# Patient Record
Sex: Male | Born: 1965 | Race: Black or African American | Hispanic: No | Marital: Single | Smoking: Current every day smoker
Health system: Southern US, Community
[De-identification: ages and names within clinical notes are randomized; demographics above are authoritative.]

## PROBLEM LIST (undated history)

## (undated) DIAGNOSIS — E785 Hyperlipidemia, unspecified: Secondary | ICD-10-CM

## (undated) DIAGNOSIS — A159 Respiratory tuberculosis unspecified: Secondary | ICD-10-CM

## (undated) DIAGNOSIS — E7800 Pure hypercholesterolemia, unspecified: Secondary | ICD-10-CM

## (undated) DIAGNOSIS — F1990 Other psychoactive substance use, unspecified, uncomplicated: Secondary | ICD-10-CM

## (undated) DIAGNOSIS — F1010 Alcohol abuse, uncomplicated: Secondary | ICD-10-CM

## (undated) DIAGNOSIS — J449 Chronic obstructive pulmonary disease, unspecified: Secondary | ICD-10-CM

## (undated) HISTORY — DX: Pure hypercholesterolemia, unspecified: E78.00

## (undated) HISTORY — PX: FINGER SURGERY: SHX640

## (undated) SURGERY — LEFT HEART CATH
Anesthesia: LOCAL

---

## 2015-04-21 ENCOUNTER — Emergency Department (HOSPITAL_COMMUNITY): Payer: Self-pay

## 2015-04-21 ENCOUNTER — Encounter (HOSPITAL_COMMUNITY): Payer: Self-pay | Admitting: Emergency Medicine

## 2015-04-21 ENCOUNTER — Emergency Department (HOSPITAL_COMMUNITY)
Admission: EM | Admit: 2015-04-21 | Discharge: 2015-04-21 | Disposition: A | Discharge status: Home / Self Care | Payer: Self-pay | Attending: Emergency Medicine | Admitting: Emergency Medicine

## 2015-04-21 DIAGNOSIS — Z720 Tobacco use: Secondary | ICD-10-CM | POA: Insufficient documentation

## 2015-04-21 DIAGNOSIS — S199XXA Unspecified injury of neck, initial encounter: Secondary | ICD-10-CM | POA: Insufficient documentation

## 2015-04-21 DIAGNOSIS — Y9389 Activity, other specified: Secondary | ICD-10-CM | POA: Insufficient documentation

## 2015-04-21 DIAGNOSIS — Y92410 Unspecified street and highway as the place of occurrence of the external cause: Secondary | ICD-10-CM | POA: Insufficient documentation

## 2015-04-21 DIAGNOSIS — S0990XA Unspecified injury of head, initial encounter: Secondary | ICD-10-CM | POA: Insufficient documentation

## 2015-04-21 DIAGNOSIS — S3992XA Unspecified injury of lower back, initial encounter: Secondary | ICD-10-CM | POA: Insufficient documentation

## 2015-04-21 DIAGNOSIS — Y998 Other external cause status: Secondary | ICD-10-CM | POA: Insufficient documentation

## 2015-04-21 MED ORDER — HYDROCODONE-ACETAMINOPHEN 5-325 MG PO TABS
1.0000 | ORAL_TABLET | ORAL | Status: DC
  Filled 2015-04-21: qty 1

## 2015-04-21 MED ORDER — NAPROXEN 500 MG PO TABS: 500.0000 mg | ORAL_TABLET | Freq: Two times a day (BID) | ORAL | Status: DC

## 2015-04-21 NOTE — Discharge Instructions (Signed)
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
## 2016-08-23 ENCOUNTER — Encounter (HOSPITAL_COMMUNITY): Payer: Self-pay | Admitting: Emergency Medicine

## 2016-08-23 ENCOUNTER — Inpatient Hospital Stay (HOSPITAL_COMMUNITY)
Admission: EM | Admit: 2016-08-23 | Discharge: 2016-08-24 | DRG: 282 | Disposition: A | Discharge status: Home / Self Care | Payer: Self-pay | Attending: Internal Medicine | Admitting: Internal Medicine

## 2016-08-23 ENCOUNTER — Inpatient Hospital Stay (HOSPITAL_COMMUNITY): Payer: Self-pay

## 2016-08-23 ENCOUNTER — Emergency Department (HOSPITAL_COMMUNITY): Payer: Self-pay

## 2016-08-23 DIAGNOSIS — I214 Non-ST elevation (NSTEMI) myocardial infarction: Principal | ICD-10-CM

## 2016-08-23 DIAGNOSIS — E785 Hyperlipidemia, unspecified: Secondary | ICD-10-CM | POA: Diagnosis present

## 2016-08-23 DIAGNOSIS — R079 Chest pain, unspecified: Secondary | ICD-10-CM

## 2016-08-23 DIAGNOSIS — R7989 Other specified abnormal findings of blood chemistry: Secondary | ICD-10-CM | POA: Diagnosis present

## 2016-08-23 DIAGNOSIS — Z8249 Family history of ischemic heart disease and other diseases of the circulatory system: Secondary | ICD-10-CM

## 2016-08-23 DIAGNOSIS — F1410 Cocaine abuse, uncomplicated: Secondary | ICD-10-CM | POA: Diagnosis present

## 2016-08-23 DIAGNOSIS — Z720 Tobacco use: Secondary | ICD-10-CM

## 2016-08-23 DIAGNOSIS — F17200 Nicotine dependence, unspecified, uncomplicated: Secondary | ICD-10-CM | POA: Diagnosis present

## 2016-08-23 DIAGNOSIS — F17210 Nicotine dependence, cigarettes, uncomplicated: Secondary | ICD-10-CM | POA: Diagnosis present

## 2016-08-23 DIAGNOSIS — R778 Other specified abnormalities of plasma proteins: Secondary | ICD-10-CM | POA: Diagnosis present

## 2016-08-23 DIAGNOSIS — F1210 Cannabis abuse, uncomplicated: Secondary | ICD-10-CM | POA: Diagnosis present

## 2016-08-23 DIAGNOSIS — I200 Unstable angina: Secondary | ICD-10-CM | POA: Diagnosis present

## 2016-08-23 LAB — CBC
HCT: 40.1 % (ref 39.0–52.0)
HEMOGLOBIN: 13.7 g/dL (ref 13.0–17.0)
MCH: 32.8 pg (ref 26.0–34.0)
MCHC: 34.2 g/dL (ref 30.0–36.0)
MCV: 95.9 fL (ref 78.0–100.0)
Platelets: 197 10*3/uL (ref 150–400)
RBC: 4.18 MIL/uL — AB (ref 4.22–5.81)
RDW: 12.2 % (ref 11.5–15.5)
WBC: 11.1 10*3/uL — ABNORMAL HIGH (ref 4.0–10.5)

## 2016-08-23 LAB — T4, FREE: Free T4: 0.79 ng/dL (ref 0.61–1.12)

## 2016-08-23 LAB — RAPID URINE DRUG SCREEN, HOSP PERFORMED
AMPHETAMINES: NOT DETECTED
Barbiturates: NOT DETECTED
Benzodiazepines: NOT DETECTED
Cocaine: POSITIVE — AB
OPIATES: NOT DETECTED
Tetrahydrocannabinol: POSITIVE — AB

## 2016-08-23 LAB — HEPATIC FUNCTION PANEL
ALBUMIN: 4.2 g/dL (ref 3.5–5.0)
ALK PHOS: 83 U/L (ref 38–126)
ALT: 35 U/L (ref 17–63)
AST: 37 U/L (ref 15–41)
BILIRUBIN TOTAL: 1.4 mg/dL — AB (ref 0.3–1.2)
Bilirubin, Direct: 0.2 mg/dL (ref 0.1–0.5)
Indirect Bilirubin: 1.2 mg/dL — ABNORMAL HIGH (ref 0.3–0.9)
Total Protein: 7.4 g/dL (ref 6.5–8.1)

## 2016-08-23 LAB — PROTIME-INR
INR: 0.99
Prothrombin Time: 13.1 seconds (ref 11.4–15.2)

## 2016-08-23 LAB — HEPARIN LEVEL (UNFRACTIONATED)
HEPARIN UNFRACTIONATED: 0.3 [IU]/mL (ref 0.30–0.70)
Heparin Unfractionated: 0.33 IU/mL (ref 0.30–0.70)

## 2016-08-23 LAB — APTT: APTT: 24 s (ref 24–36)

## 2016-08-23 LAB — BASIC METABOLIC PANEL
Anion gap: 2 — ABNORMAL LOW (ref 5–15)
BUN: 14 mg/dL (ref 6–20)
CALCIUM: 9 mg/dL (ref 8.9–10.3)
CO2: 26 mmol/L (ref 22–32)
Chloride: 108 mmol/L (ref 101–111)
Creatinine, Ser: 0.98 mg/dL (ref 0.61–1.24)
GFR calc non Af Amer: >60 mL/min (ref >60)
GLUCOSE: 106 mg/dL — AB (ref 65–99)
Potassium: 4.3 mmol/L (ref 3.5–5.1)
Sodium: 136 mmol/L (ref 135–145)

## 2016-08-23 LAB — ECHOCARDIOGRAM COMPLETE
Height: 69 in
WEIGHTICAEL: 2320 [oz_av]

## 2016-08-23 LAB — TROPONIN I
TROPONIN I: 0.03 ng/mL — AB (ref <0.03)
TROPONIN I: 0.2 ng/mL — AB (ref <0.03)
Troponin I: 0.15 ng/mL (ref <0.03)
Troponin I: 0.06 ng/mL (ref <0.03)

## 2016-08-23 LAB — BRAIN NATRIURETIC PEPTIDE: B NATRIURETIC PEPTIDE 5: 23 pg/mL (ref 0.0–100.0)

## 2016-08-23 LAB — MAGNESIUM: Magnesium: 2 mg/dL (ref 1.7–2.4)

## 2016-08-23 LAB — TSH: TSH: 3.15 u[IU]/mL (ref 0.350–4.500)

## 2016-08-23 MED ORDER — HEPARIN (PORCINE) IN NACL 100-0.45 UNIT/ML-% IJ SOLN
900.0000 [IU]/h | INTRAMUSCULAR | Status: DC
  Administered 2016-08-23: 900 [IU]/h via INTRAVENOUS
  Filled 2016-08-23 (×2): qty 250

## 2016-08-23 MED ORDER — ACETAMINOPHEN 325 MG PO TABS: 650.0000 mg | ORAL_TABLET | ORAL | Status: DC | PRN

## 2016-08-23 MED ORDER — NITROGLYCERIN 0.4 MG SL SUBL: 0.4000 mg | SUBLINGUAL_TABLET | SUBLINGUAL | Status: DC | PRN

## 2016-08-23 MED ORDER — NICOTINE 7 MG/24HR TD PT24
7.0000 mg | MEDICATED_PATCH | Freq: Every day | TRANSDERMAL | Status: DC
  Administered 2016-08-23 – 2016-08-24 (×2): 7 mg via TRANSDERMAL
  Filled 2016-08-23 (×2): qty 1

## 2016-08-23 MED ORDER — ONDANSETRON HCL 4 MG/2ML IJ SOLN: 4.0000 mg | Freq: Four times a day (QID) | INTRAMUSCULAR | Status: DC | PRN

## 2016-08-23 MED ORDER — HEPARIN BOLUS VIA INFUSION
3500.0000 [IU] | Freq: Once | INTRAVENOUS | Status: AC
Start: 2016-08-23 — End: 2016-08-23
  Administered 2016-08-23: 3500 [IU] via INTRAVENOUS

## 2016-08-23 MED ORDER — ATORVASTATIN CALCIUM 40 MG PO TABS
40.0000 mg | ORAL_TABLET | Freq: Every day | ORAL | Status: DC
Start: 2016-08-23 — End: 2016-08-24
  Administered 2016-08-23 – 2016-08-24 (×2): 40 mg via ORAL
  Filled 2016-08-23 (×2): qty 1

## 2016-08-23 MED ORDER — ASPIRIN EC 81 MG PO TBEC
81.0000 mg | DELAYED_RELEASE_TABLET | Freq: Every day | ORAL | Status: DC
  Administered 2016-08-24: 81 mg via ORAL
  Filled 2016-08-23: qty 1

## 2016-08-23 NOTE — Progress Notes (Signed)
ANTICOAGULATION CONSULT NOTE  Pharmacy Consult for heparin Indication: chest pain/ACS  No Known Allergies  Patient Measurements: Height: 5\' 11"  (180.3 cm) Weight: 133 lb 4.8 oz (60.5 kg) IBW/kg (Calculated) : 75.3 Heparin Dosing Weight: 65.8 kg  Vital Signs: Temp: 98.1 F (36.7 C) (09/17 1442) Temp Source: Oral (09/17 1442) BP: 124/78 (09/17 1442) Pulse Rate: 56 (09/17 1442)  Labs:  Recent Labs  08/23/16 0732 08/23/16 0942 08/23/16 1537 08/23/16 1803  HGB 13.7  --   --   --   HCT 40.1  --   --   --   PLT 197  --   --   --   APTT 24  --   --   --   LABPROT 13.1  --   --   --   INR 0.99  --   --   --   HEPARINUNFRC  --   --   --  0.33  CREATININE 0.98  --   --   --   TROPONINI 0.03* 0.20* 0.15*  --     Estimated Creatinine Clearance: 76.3 mL/min (by C-G formula based on SCr of 0.98 mg/dL).  . heparin 900 Units/hr (08/23/16 1123)    Medical History: History reviewed. No pertinent past medical history.  Medications:  No meds PTA  Assessment: 50 yo man with CP started on IV heparin.  Initial heparin level at goal.  No bleeding or complications noted per chart notes.  Goal of Therapy:  Heparin level 0.3-0.7 units/ml Monitor platelets by anticoagulation protocol: Yes   Plan:  Continue IV heparin at current rate. Recheck heparin level at midnight to confirm. Daily heparin level and CBC. F/u plans for further cardiac workup.  Tad MooreJessica Rei Medlen, Pharm D, BCPS  Clinical Pharmacist Pager [phone]  08/23/2016 7:23 PM

## 2016-08-23 NOTE — Progress Notes (Signed)
ANTICOAGULATION CONSULT NOTE  Pharmacy Consult for heparin Indication: chest pain/ACS  No Known Allergies  Patient Measurements: Height: 5\' 11"  (180.3 cm) Weight: 133 lb 4.8 oz (60.5 kg) IBW/kg (Calculated) : 75.3 Heparin Dosing Weight: 65.8 kg  Vital Signs: Temp: 99 F (37.2 C) (09/17 2019) Temp Source: Oral (09/17 2019) BP: 116/61 (09/17 2019) Pulse Rate: 87 (09/17 2019)  Labs:  Recent Labs  08/23/16 0732 08/23/16 0942 08/23/16 1537 08/23/16 1803 08/23/16 2300  HGB 13.7  --   --   --   --   HCT 40.1  --   --   --   --   PLT 197  --   --   --   --   APTT 24  --   --   --   --   LABPROT 13.1  --   --   --   --   INR 0.99  --   --   --   --   HEPARINUNFRC  --   --   --  0.33 0.30  CREATININE 0.98  --   --   --   --   TROPONINI 0.03* 0.20* 0.15*  --   --     Assessment: 50 y.o. male with chest pain for heparin   Goal of Therapy:  Heparin level 0.3-0.7 units/ml Monitor platelets by anticoagulation protocol: Yes   Plan:  Continue Heparin at current rate  Follow-up am labs.  Geannie RisenGreg Jodi Kappes, PharmD, BCPS   08/23/2016 11:42 PM

## 2016-08-23 NOTE — ED Notes (Signed)
[facility], unable to take report at this time.

## 2016-08-23 NOTE — ED Triage Notes (Signed)
Patient brought in via EMS from home. Alert and oriented. Airway patent. Patient c/o central chest pain that radiates into left arm and umbilical region. Per patient started a week ago and improved on it's on but returned this morning. Patient reports shortness of breath, diaphoresis, nausea, and vomiting that is associated with chest pain. Patient given 324mg  PO aspirin in route. Patient sinus brady on monitor per EMS. Denies any cardiac hx.

## 2016-08-23 NOTE — ED Notes (Signed)
Attempted report x2 to ICU.

## 2016-08-23 NOTE — ED Notes (Signed)
Attempted report x 2 

## 2016-08-23 NOTE — Consult Note (Signed)
CARDIOLOGY CONSULT NOTE   Patient ID: Adam Mercado MRN: [id number], DOB/AGE: [birth year]   Admit date: 08/23/2016 Date of Consult: 08/23/2016  Primary Physician: No PCP Per Patient Primary Cardiologist: new patient  Reason for consult:  Chest pain, troponin elevation  Problem List  History reviewed. No pertinent past medical history.  Past Surgical History:  Procedure Laterality Date  . FINGER SURGERY       Allergies  No Known Allergies  HPI   This is a 50 year old patient who was transferred here from [facility], where he presented with chest pain that started 2 days ago, he had similar presentation on 2 other occasions in the last 3 weeks. The chest pain can be exertional or nonexertional interest retrosternal pressure-like with no radiation. There is no associated shortness of breath no palpitations syncope lower extremity edema or orthopnea. The patient is an active smoker he smokes half pack a day, he also uses cocaine on a regular basis last time 2 days ago, his urine tested positive for cocaine and THC. He has never had any cardiac problem, doesn't see Dr. a regular basis, his family history is negative for premature coronary artery disease in his father had a stroke. He doesn't use any medication on a regular basis. His troponin was 0.03 and raised to 0.20, he is currently   Inpatient Medications  . [START ON 08/24/2016] aspirin EC  81 mg Oral Daily  . atorvastatin  40 mg Oral q1800  . nicotine  7 mg Transdermal Daily    Family History Family History  Problem Relation Age of Onset  . Diabetes Brother   . Hypertension Mother   . Hypertension Father   . Stroke Father      Social History Social History   Social History  . Marital status: Single    Spouse name: N/A  . Number of children: N/A  . Years of education: N/A   Occupational History  . Not on file.   Social History Main Topics  . Smoking status: Current Every Day Smoker    Packs/day:  0.50    Years: 30.00    Types: Cigarettes  . Smokeless tobacco: Never Used  . Alcohol use 48.0 oz/week    80 Cans of beer per week  . Drug use: No  . Sexual activity: Not on file   Other Topics Concern  . Not on file   Social History Narrative  . No narrative on file     Review of Systems  General:  No chills, fever, night sweats or weight changes.  Cardiovascular:  No chest pain, dyspnea on exertion, edema, orthopnea, palpitations, paroxysmal nocturnal dyspnea. Dermatological: No rash, lesions/masses Respiratory: No cough, dyspnea Urologic: No hematuria, dysuria Abdominal:   No nausea, vomiting, diarrhea, bright red blood per rectum, melena, or hematemesis Neurologic:  No visual changes, wkns, changes in mental status. All other systems reviewed and are otherwise negative except as noted above.  Physical Exam  Blood pressure 124/78, pulse (!) 56, temperature 98.1 F (36.7 C), temperature source Oral, resp. rate 18, height 5\' 11"  (1.803 m), weight 133 lb 4.8 oz (60.5 kg), SpO2 100 %.  General: Pleasant, NAD Psych: Normal affect. Neuro: Alert and oriented X 3. Moves all extremities spontaneously. HEENT: Normal  Neck: Supple without bruits or JVD. Lungs:  Resp regular and unlabored, CTA. Heart: RRR no s3, s4, or murmurs. Abdomen: Soft, non-tender, non-distended, BS + x 4.  Extremities: No clubbing, cyanosis or edema. DP/PT/Radials 2+ and equal bilaterally.  Labs   Recent Labs  08/23/16 0732 08/23/16 0942  TROPONINI 0.03* 0.20*   Lab Results  Component Value Date   WBC 11.1 (H) 08/23/2016   HGB 13.7 08/23/2016   HCT 40.1 08/23/2016   MCV 95.9 08/23/2016   PLT 197 08/23/2016    Recent Labs Lab 08/23/16 0732  NA 136  K 4.3  CL 108  CO2 26  BUN 14  CREATININE 0.98  CALCIUM 9.0  PROT 7.4  BILITOT 1.4*  ALKPHOS 83  ALT 35  AST 37  GLUCOSE 106*   No results found for: CHOL, HDL, LDLCALC, TRIG No results found for: DDIMER Invalid input(s):  POCBNP  Radiology/Studies  Dg Chest 2 View  Result Date: 08/23/2016 CLINICAL DATA:  Patient with mid chest pain 1 hour ago. Shortness of breath. EXAM: CHEST  2 VIEW COMPARISON:  None. FINDINGS: The heart size and mediastinal contours are within normal limits. Both lungs are clear. The visualized skeletal structures are unremarkable. IMPRESSION: No active cardiopulmonary disease. Electronically Signed   By: Annia Beltrew  Davis M.D.   On: 08/23/2016 07:45   Echocardiogram   ECG: Sinus bradycardia, LVH with repolarization abnormalities.     ASSESSMENT AND PLAN  1. NSTEMI - possibly cocaine induced vasospasm, we will trend troponin, if continues to rise, we will schedule a cath in the am, otherwise a Lexiscan stress test (exercise contraindicated 48 hours post MI), we will continue Heparin iv, asa 81 mg po daily, atorvastatin 40 mg po daily for now.  2. Cocaine abuse - consulted  3. Smoking abuse - consulted  Signed, Tobias AlexanderKatarina Chennel Olivos, MD, [facility] Hospital[facility]ACC 08/23/2016, 4:28 PM

## 2016-08-23 NOTE — H&P (Addendum)
[facility] History and Physical   Patient: Adam Mercado ZOX:[id number]   PCP: No PCP Per Patient DOB: 09-24-1966   DOA: 08/23/2016   DOS: 08/23/2016   DOS: the patient was seen and examined on 08/23/2016  Patient coming from: The patient is coming from home.  Chief Complaint: Chest pain  HPI: Christoper Mercado is a 50 y.o. male with Past medical history of substance abuse with cocaine and marijuana. Patient doesn't do with complaints of chest pain that started last night. Patient mentions that he had an episode of chest pain 1 week ago and another episode of chest pain 3 days ago which all resolved on its own within 15 minutes after rest. Pain was centered at that time and felt like pressure. The patient had central chest pain radiating to his left arm associated with nausea vomiting as well as diaphoresis and shortness of breath. The pain was crushing in nature and was not resolving on its own and therefore he decided to come to the hospital. Patient was given aspirin 325 mg. At the time of my evaluation patient denies any active chest pain no nausea no vomiting no shortness of breath. No dizziness no focal deficit. Patient is an active smoker smokes half pack of cigarettes a day. Patient used cocaine 2 days ago. Patient also uses marijuana every now and then. Denies any alcohol abuse. Denies any history of GERD, heartburn, acid reflux, diarrhea or constipation. Leg swelling or leg tenderness. No dizziness no lightheadedness no focal deficit.  ED Course: Patient was given aspirin, EKG was not showing any ST elevation MI. Patient was initially recommended to be admitted in the [facility] but after elevation of the troponin, as per discussion with the cardiology the patient will be transferred to [facility] telemetry.  At his baseline ambulates without any support And is independent for most of his ADL; manages his medication on his own.  Review of Systems: as  mentioned in the history of present illness.  All other systems reviewed and are negative.  History reviewed. No pertinent past medical history. Past Surgical History:  Procedure Laterality Date  . FINGER SURGERY     Social History:  reports that he has been smoking Cigarettes.  He has a 15.00 pack-year smoking history. He has never used smokeless tobacco. He reports that he drinks about 48.0 oz of alcohol per week . He reports that he does not use drugs.  No Known Allergies   Family History  Problem Relation Age of Onset  . Diabetes Brother   . Hypertension Mother   . Hypertension Father   . Stroke Father      Prior to Admission medications   Not on File    Physical Exam: Vitals:   08/23/16 0930 08/23/16 1000 08/23/16 1030 08/23/16 1100  BP: 114/77 121/80 111/77 96/75  Pulse: 70 73 67 67  Resp: 21 20 22 18   Temp: 97.8 F (36.6 C)     TempSrc: Oral     SpO2: 99% 100% 99% 100%  Weight:      Height:        General: Alert, Awake and Oriented to Time, Place and Person. Appear in mild distress, affect appropriate Eyes: PERRL, Conjunctiva normal ENT: Oral Mucosa clear moist. Neck: no JVD, no Abnormal Mass Or lumps Cardiovascular: S1 and S2 Present, no Murmur, Peripheral Pulses Present Respiratory: Bilateral Air entry equal and Decreased, no use of accessory muscle, Clear to Auscultation, no Crackles, no wheezes Abdomen: Bowel Sound  present, Soft and no tenderness Skin: no redness, no Rash, no induration Extremities: no Pedal edema, no calf tenderness Neurologic: Grossly no focal neuro deficit. Bilaterally Equal motor strength  Labs on Admission:  CBC:  Recent Labs Lab 08/23/16 0732  WBC 11.1*  HGB 13.7  HCT 40.1  MCV 95.9  PLT 197   Basic Metabolic Panel:  Recent Labs Lab 08/23/16 0732  NA 136  K 4.3  CL 108  CO2 26  GLUCOSE 106*  BUN 14  CREATININE 0.98  CALCIUM 9.0  MG 2.0   GFR: Estimated Creatinine Clearance: 83 mL/min (by C-G formula based  on SCr of 0.98 mg/dL). Liver Function Tests:  Recent Labs Lab 08/23/16 0732  AST 37  ALT 35  ALKPHOS 83  BILITOT 1.4*  PROT 7.4  ALBUMIN 4.2   No results for input(s): LIPASE, AMYLASE in the last 168 hours. No results for input(s): AMMONIA in the last 168 hours. Coagulation Profile:  Recent Labs Lab 08/23/16 0732  INR 0.99   Cardiac Enzymes:  Recent Labs Lab 08/23/16 0732 08/23/16 0942  TROPONINI 0.03* 0.20*   BNP (last 3 results) No results for input(s): PROBNP in the last 8760 hours. HbA1C: No results for input(s): HGBA1C in the last 72 hours. CBG: No results for input(s): GLUCAP in the last 168 hours. Lipid Profile: No results for input(s): CHOL, HDL, LDLCALC, TRIG, CHOLHDL, LDLDIRECT in the last 72 hours. Thyroid Function Tests:  Recent Labs  08/23/16 0732  TSH 3.150   Anemia Panel: No results for input(s): VITAMINB12, FOLATE, FERRITIN, TIBC, IRON, RETICCTPCT in the last 72 hours. Urine analysis: No results found for: COLORURINE, APPEARANCEUR, LABSPEC, PHURINE, GLUCOSEU, HGBUR, BILIRUBINUR, KETONESUR, PROTEINUR, UROBILINOGEN, NITRITE, LEUKOCYTESUR  Radiological Exams on Admission: Dg Chest 2 View  Result Date: 08/23/2016 CLINICAL DATA:  Patient with mid chest pain 1 hour ago. Shortness of breath. EXAM: CHEST  2 VIEW COMPARISON:  None. FINDINGS: The heart size and mediastinal contours are within normal limits. Both lungs are clear. The visualized skeletal structures are unremarkable. IMPRESSION: No active cardiopulmonary disease. Electronically Signed   By: Annia Beltrew  Davis M.D.   On: 08/23/2016 07:45   EKG: Independently reviewed. normal sinus rhythm, nonspecific ST and T waves changes, significant LVH.  Assessment/Plan 1. Unstable angina (HCC) Elevated troponin. Cocaine abuse. Patient presents with complaints of chest pain symptoms are typical of unstable angina with gradual progression. Patient also has elevation of troponin from 0.03-0.20. EKG shows  LVH. No ST-T wave changes this D of ongoing active ischemia. Echocardiogram is ordered. Discussed with cardiology Dr. Delton SeeNelson, and the patient will be transferred to [facility] for cardiology care and consultation. Patient already received 325 mg aspirin, will continue with 81 mg aspirin. Continue IV heparin. When necessary nitroglycerin, currently chest pain-free. Likely in the setting of cocaine abuse. Avoid beta blocker. Check lipid profile and start on Lipitor 40. Patient is at high risk for rapid deterioration given his cocaine abuse and possible related ongoing ischemia, Pt will remain nothing by mouth until seen by cardiology for further evaluation.  2. Active smoker. Recommended patient to quit smoking. Nicotine patch.  3. Substance abuse. Patient may benefit from social worker counseling. Avoid beta blockers in the setting of cocaine abuse. Monitor for withdrawal.   Nutrition: Currently nothing by mouth DVT Prophylaxis: on therapeutic anticoagulation.  Advance goals of care discussion: Full code   Consults: Phone consultation with cardiology  Family Communication: family was present at bedside, at the time of interview.  Opportunity  was given to ask question and all questions were answered satisfactorily.  Disposition: Admitted as inpatient, telemetry unit. Likely to be discharged home, in 2 days.  Author: Lynden Oxford, MD [facility] Pager: [phone] 08/23/2016  If 7PM-7AM, please contact night-coverage [email or web address] Password [email or web address]

## 2016-08-23 NOTE — ED Notes (Signed)
Pt left with [facility]link, alert and oriented, in no pain at this time.

## 2016-08-23 NOTE — ED Notes (Signed)
Chrissy,[facility] notified that pt is en route with carelink to [place].

## 2016-08-23 NOTE — ED Notes (Signed)
Called bed control concerning bed placement, no beds available at this time.

## 2016-08-23 NOTE — Progress Notes (Signed)
Troponin of 0.15 called to me. Previous troponin 0.20 in which Md is aware. Pt denies pain and I have reinforced to call for any further chest pain of issues.

## 2016-08-23 NOTE — ED Provider Notes (Signed)
[facility]-EMERGENCY DEPT Provider Note   CSN: [id number] Arrival date & time: 08/23/16  6578     History   Chief Complaint Chief Complaint  Patient presents with  . Chest Pain    HPI Adam Mercado is a 50 y.o. male.  HPI  Pt was seen at 0725. Per EMS and pt report, c/o gradual onset and persistence of multiple intermittent episodes of mid-sternal chest "pain" for the past 1 week. Pt describes the CP as "dull," located in his mid-chest, with radiation into his left arm. Has been associated with SOB, nausea, and diaphoresis. Symptoms occur with exertion, last approximately 30 to 45 minutes before resolving with rest. EMS gave ASA with improvement of his symptoms. Denies hx of same. Denies palpitations, no cough, no abd pain, no back pain, no fevers.   History reviewed. No pertinent past medical history.  There are no active problems to display for this patient.   Past Surgical History:  Procedure Laterality Date  . FINGER SURGERY         Home Medications    Prior to Admission medications   Medication Sig Start Date End Date Taking? Authorizing Provider  naproxen (NAPROSYN) 500 MG tablet Take 1 tablet (500 mg total) by mouth 2 (two) times daily. 04/21/15   Linwood Dibbles, MD    Family History Family History  Problem Relation Age of Onset  . Diabetes Brother     Social History Social History  Substance Use Topics  . Smoking status: Current Every Day Smoker    Packs/day: 0.50    Years: 30.00    Types: Cigarettes  . Smokeless tobacco: Never Used  . Alcohol use 48.0 oz/week    80 Cans of beer per week     Allergies   Review of patient's allergies indicates no known allergies.   Review of Systems Review of Systems ROS: Statement: All systems negative except as marked or noted in the HPI; Constitutional: Negative for fever and chills. ; ; Eyes: Negative for eye pain, redness and discharge. ; ; ENMT: Negative for ear pain, hoarseness, nasal congestion, sinus pressure and  sore throat. ; ; Cardiovascular: +CP, SOB, diaphoresis. Negative for palpitations, and peripheral edema. ; ; Respiratory: Negative for cough, wheezing and stridor. ; ; Gastrointestinal: +nausea. Negative for vomiting, diarrhea, abdominal pain, blood in stool, hematemesis, jaundice and rectal bleeding. . ; ; Genitourinary: Negative for dysuria, flank pain and hematuria. ; ; Musculoskeletal: Negative for back pain and neck pain. Negative for swelling and trauma.; ; Skin: Negative for pruritus, rash, abrasions, blisters, bruising and skin lesion.; ; Neuro: Negative for headache, lightheadedness and neck stiffness. Negative for weakness, altered level of consciousness, altered mental status, extremity weakness, paresthesias, involuntary movement, seizure and syncope.       Physical Exam Updated Vital Signs BP 139/82 (BP Location: Right Arm)   Pulse (!) 56   Temp 97.4 F (36.3 C) (Oral)   Resp 18   Ht 5\' 9"  (1.753 m)   Wt 145 lb (65.8 kg)   SpO2 99%   BMI 21.41 kg/m   Physical Exam 0730: Physical examination:  Nursing notes reviewed; Vital signs and O2 SAT reviewed;  Constitutional: Well developed, Well nourished, Well hydrated, In no acute distress; Head:  Normocephalic, atraumatic; Eyes: EOMI, PERRL, No scleral icterus; ENMT: Mouth and pharynx normal, Mucous membranes moist; Neck: Supple, Full range of motion, No lymphadenopathy; Cardiovascular: Regular rate and rhythm, No gallop; Respiratory: Breath sounds clear & equal bilaterally, No wheezes.  Speaking full sentences  with ease, Normal respiratory effort/excursion; Chest: Nontender, Movement normal; Abdomen: Soft, Nontender, Nondistended, Normal bowel sounds; Genitourinary: No CVA tenderness; Extremities: Pulses normal, No tenderness, No edema, No calf edema or asymmetry.; Neuro: AA&Ox3, Major CN grossly intact.  Speech clear. No gross focal motor or sensory deficits in extremities.; Skin: Color normal, Warm, Dry.   ED Treatments / Results    Labs (all labs ordered are listed, but only abnormal results are displayed)   EKG  EKG Interpretation  Date/Time:  Sunday August 23 2016 07:03:53 EDT Ventricular Rate:  54 PR Interval:    QRS Duration: 109 QT Interval:  458 QTC Calculation: 434 R Axis:   80 Text Interpretation:  Sinus rhythm Left ventricular hypertrophy Nonspecific ST and T wave abnormality Baseline wander Artifact No old tracing to compare Confirmed by [facility]  MD, Nicholos Johns [phone]) on 08/23/2016 7:22:57 AM       EKG Interpretation  Date/Time:  Sunday August 23 2016 09:04:27 EDT Ventricular Rate:  78 PR Interval:    QRS Duration: 105 QT Interval:  404 QTC Calculation: 461 R Axis:   79 Text Interpretation:  Sinus rhythm Probable left atrial enlargement Left ventricular hypertrophy since last tracing no significant change Confirmed by [facility]  MD, Nicholos Johns [phone]) on 08/23/2016 9:15:12 AM        Radiology   Procedures Procedures (including critical care time)  Medications Ordered in ED Medications - No data to display   Initial Impression / Assessment and Plan / ED Course  I have reviewed the triage vital signs and the nursing notes.  Pertinent labs & imaging results that were available during my care of the patient were reviewed by me and considered in my medical decision making (see chart for details).  MDM Reviewed: previous chart, nursing note and vitals Reviewed previous: labs and ECG Interpretation: labs, ECG and x-ray   Results for orders placed or performed during the hospital encounter of 08/23/16  Basic metabolic panel  Result Value Ref Range   Sodium 136 135 - 145 mmol/L   Potassium 4.3 3.5 - 5.1 mmol/L   Chloride 108 101 - 111 mmol/L   CO2 26 22 - 32 mmol/L   Glucose, Bld 106 (H) 65 - 99 mg/dL   BUN 14 6 - 20 mg/dL   Creatinine, Ser 0.98 0.61 - 1.24 mg/dL   Calcium 9.0 8.9 - 11.9 mg/dL   GFR calc non Af Amer >60 >60 mL/min   GFR calc Af Amer >60 >60 mL/min   Anion gap  2 (L) 5 - 15  CBC  Result Value Ref Range   WBC 11.1 (H) 4.0 - 10.5 K/uL   RBC 4.18 (L) 4.22 - 5.81 MIL/uL   Hemoglobin 13.7 13.0 - 17.0 g/dL   HCT 14.7 82.9 - 56.2 %   MCV 95.9 78.0 - 100.0 fL   MCH 32.8 26.0 - 34.0 pg   MCHC 34.2 30.0 - 36.0 g/dL   RDW 13.0 86.5 - 78.4 %   Platelets 197 150 - 400 K/uL  Troponin I  Result Value Ref Range   Troponin I 0.03 (HH) <0.03 ng/mL   Dg Chest 2 View Result Date: 08/23/2016 CLINICAL DATA:  Patient with mid chest pain 1 hour ago. Shortness of breath. EXAM: CHEST  2 VIEW COMPARISON:  None. FINDINGS: The heart size and mediastinal contours are within normal limits. Both lungs are clear. The visualized skeletal structures are unremarkable. IMPRESSION: No active cardiopulmonary disease. Electronically Signed   By: Francis Gaines.D.  On: 08/23/2016 07:45    0835:  Remains symptom-free while in the ED. Dx and testing d/w pt.  Questions answered.  Verb understanding, agreeable to observation admit. T/C to [facility] Dr. Allena KatzPatel, case discussed, including:  HPI, pertinent PM/SHx, VS/PE, dx testing, ED course and treatment:  Agreeable to admit, requests he will come to the ED for evaluation.      Final Clinical Impressions(s) / ED Diagnoses   Final diagnoses:  None    New Prescriptions New Prescriptions   No medications on file     Samuel JesterKathleen Dearius Hoffmann, DO 08/26/16 1659

## 2016-08-23 NOTE — Progress Notes (Signed)
*  PRELIMINARY RESULTS* Echocardiogram 2D Echocardiogram has been performed.  Adam Mercado, Adam Mercado 08/23/2016, 11:30 AM

## 2016-08-23 NOTE — ED Notes (Signed)
Echo in progress.

## 2016-08-23 NOTE — Progress Notes (Signed)
I notified Dr. Joseph ArtWoods and Tereso NewcomerScott Weaver of pts arrival to floor

## 2016-08-23 NOTE — Progress Notes (Signed)
ANTICOAGULATION CONSULT NOTE - Initial Consult  Pharmacy Consult for heparin Indication: chest pain/ACS  No Known Allergies  Patient Measurements: Height: 5\' 9"  (175.3 cm) Weight: 145 lb (65.8 kg) IBW/kg (Calculated) : 70.7 Heparin Dosing Weight: 65.8 kg  Vital Signs: Temp: 97.8 F (36.6 C) (09/17 0930) Temp Source: Oral (09/17 0930) BP: 114/77 (09/17 0930) Pulse Rate: 67 (09/17 0930)  Labs:  Recent Labs  08/23/16 0732  HGB 13.7  HCT 40.1  PLT 197  CREATININE 0.98  TROPONINI 0.03*    Estimated Creatinine Clearance: 83 mL/min (by C-G formula based on SCr of 0.98 mg/dL).   Medical History: History reviewed. No pertinent past medical history.  Medications:  No meds PTA Assessment: 50 yo man with CP to start heparin Goal of Therapy:  Heparin level 0.3-0.7 units/ml Monitor platelets by anticoagulation protocol: Yes   Plan:  Heparin bolus 3500 units and drip at 900 units/hr Check heparin level 8 hours after start and daily while on heparin CBC daily Monitor for bleeding complications  Nishita Isaacks Poteet 08/23/2016,9:51 AM

## 2016-08-23 NOTE — ED Notes (Signed)
Attempted report to ICU x 1.

## 2016-08-23 NOTE — ED Notes (Signed)
Attempted report x1. 

## 2016-08-23 NOTE — ED Notes (Signed)
Heparin infusion rates verified with brandi daniels,rn

## 2016-08-23 NOTE — ED Notes (Signed)
CRITICAL VALUE ALERT  Critical value received:  Troponin 0.03 Date of notification:  08/23/16  Time of notification:  0818  Critical value read back:Yes.    Nurse who received alert:  Gertha Calkinmeagan Samil Mecham,rn  MD notified (1st page):  Dr. Clarene DukeMcManus

## 2016-08-24 ENCOUNTER — Encounter (HOSPITAL_COMMUNITY)
Admission: EM | Disposition: A | Discharge status: Home / Self Care | Payer: Self-pay | Source: Home / Self Care | Attending: Internal Medicine

## 2016-08-24 DIAGNOSIS — I25110 Atherosclerotic heart disease of native coronary artery with unstable angina pectoris: Secondary | ICD-10-CM

## 2016-08-24 HISTORY — PX: CARDIAC CATHETERIZATION: SHX172

## 2016-08-24 LAB — BASIC METABOLIC PANEL
ANION GAP: 8 (ref 5–15)
BUN: 12 mg/dL (ref 6–20)
CALCIUM: 9.2 mg/dL (ref 8.9–10.3)
CO2: 24 mmol/L (ref 22–32)
CREATININE: 1.09 mg/dL (ref 0.61–1.24)
Chloride: 107 mmol/L (ref 101–111)
GFR calc Af Amer: >60 mL/min (ref >60)
GLUCOSE: 110 mg/dL — AB (ref 65–99)
Potassium: 4.3 mmol/L (ref 3.5–5.1)
Sodium: 139 mmol/L (ref 135–145)

## 2016-08-24 LAB — CBC
HEMATOCRIT: 42.3 % (ref 39.0–52.0)
Hemoglobin: 14.3 g/dL (ref 13.0–17.0)
MCH: 32.9 pg (ref 26.0–34.0)
MCHC: 33.8 g/dL (ref 30.0–36.0)
MCV: 97.2 fL (ref 78.0–100.0)
Platelets: 221 10*3/uL (ref 150–400)
RBC: 4.35 MIL/uL (ref 4.22–5.81)
RDW: 12 % (ref 11.5–15.5)
WBC: 8.8 10*3/uL (ref 4.0–10.5)

## 2016-08-24 LAB — LIPID PANEL
Cholesterol: 213 mg/dL — ABNORMAL HIGH (ref 0–200)
HDL: 77 mg/dL (ref >40)
LDL Cholesterol: 127 mg/dL — ABNORMAL HIGH (ref 0–99)
Total CHOL/HDL Ratio: 2.8 RATIO
Triglycerides: 47 mg/dL (ref <150)
VLDL: 9 mg/dL (ref 0–40)

## 2016-08-24 LAB — PROTIME-INR
INR: 0.98
PROTHROMBIN TIME: 13 s (ref 11.4–15.2)

## 2016-08-24 LAB — HEPARIN LEVEL (UNFRACTIONATED): Heparin Unfractionated: 0.3 IU/mL (ref 0.30–0.70)

## 2016-08-24 SURGERY — LEFT HEART CATH AND CORONARY ANGIOGRAPHY

## 2016-08-24 MED ORDER — MIDAZOLAM HCL 2 MG/2ML IJ SOLN
INTRAMUSCULAR | Status: DC | PRN
  Administered 2016-08-24: 2 mg via INTRAVENOUS

## 2016-08-24 MED ORDER — SODIUM CHLORIDE 0.9 % WEIGHT BASED INFUSION: 1.0000 mL/kg/h | INTRAVENOUS | Status: DC

## 2016-08-24 MED ORDER — SODIUM CHLORIDE 0.9% FLUSH: 3.0000 mL | INTRAVENOUS | Status: DC | PRN

## 2016-08-24 MED ORDER — SODIUM CHLORIDE 0.9 % WEIGHT BASED INFUSION
3.0000 mL/kg/h | INTRAVENOUS | Status: DC
  Administered 2016-08-24: 3 mL/kg/h via INTRAVENOUS

## 2016-08-24 MED ORDER — FENTANYL CITRATE (PF) 100 MCG/2ML IJ SOLN
INTRAMUSCULAR | Status: AC
  Filled 2016-08-24: qty 2

## 2016-08-24 MED ORDER — IOPAMIDOL (ISOVUE-370) INJECTION 76%
INTRAVENOUS | Status: AC
  Filled 2016-08-24: qty 100

## 2016-08-24 MED ORDER — ASPIRIN 81 MG PO TBEC: 81.0000 mg | DELAYED_RELEASE_TABLET | Freq: Every day | ORAL | 0 refills | Status: AC

## 2016-08-24 MED ORDER — HEPARIN SODIUM (PORCINE) 1000 UNIT/ML IJ SOLN
INTRAMUSCULAR | Status: DC | PRN
  Administered 2016-08-24: 3000 [IU] via INTRAVENOUS

## 2016-08-24 MED ORDER — IOPAMIDOL (ISOVUE-370) INJECTION 76%
INTRAVENOUS | Status: DC | PRN
  Administered 2016-08-24: 75 mL via INTRA_ARTERIAL

## 2016-08-24 MED ORDER — LIDOCAINE HCL (PF) 1 % IJ SOLN
INTRAMUSCULAR | Status: AC
  Filled 2016-08-24: qty 30

## 2016-08-24 MED ORDER — HEPARIN (PORCINE) IN NACL 2-0.9 UNIT/ML-% IJ SOLN
INTRAMUSCULAR | Status: AC
  Filled 2016-08-24: qty 500

## 2016-08-24 MED ORDER — VERAPAMIL HCL 2.5 MG/ML IV SOLN
INTRAVENOUS | Status: AC
  Filled 2016-08-24: qty 2

## 2016-08-24 MED ORDER — SODIUM CHLORIDE 0.9 % WEIGHT BASED INFUSION: 3.0000 mL/kg/h | INTRAVENOUS | Status: DC

## 2016-08-24 MED ORDER — MIDAZOLAM HCL 2 MG/2ML IJ SOLN
INTRAMUSCULAR | Status: AC
  Filled 2016-08-24: qty 2

## 2016-08-24 MED ORDER — SODIUM CHLORIDE 0.9% FLUSH: 3.0000 mL | Freq: Two times a day (BID) | INTRAVENOUS | Status: DC

## 2016-08-24 MED ORDER — SODIUM CHLORIDE 0.9% FLUSH
3.0000 mL | Freq: Two times a day (BID) | INTRAVENOUS | Status: DC
  Administered 2016-08-24: 3 mL via INTRAVENOUS

## 2016-08-24 MED ORDER — HEPARIN SODIUM (PORCINE) 1000 UNIT/ML IJ SOLN
INTRAMUSCULAR | Status: AC
  Filled 2016-08-24: qty 1

## 2016-08-24 MED ORDER — HEPARIN (PORCINE) IN NACL 2-0.9 UNIT/ML-% IJ SOLN
INTRAMUSCULAR | Status: DC | PRN
  Administered 2016-08-24: 1500 mL

## 2016-08-24 MED ORDER — IOPAMIDOL (ISOVUE-370) INJECTION 76%
INTRAVENOUS | Status: AC
  Filled 2016-08-24: qty 50

## 2016-08-24 MED ORDER — HEPARIN (PORCINE) IN NACL 2-0.9 UNIT/ML-% IJ SOLN
INTRAMUSCULAR | Status: AC
  Filled 2016-08-24: qty 1000

## 2016-08-24 MED ORDER — ASPIRIN 81 MG PO CHEW: 81.0000 mg | CHEWABLE_TABLET | ORAL | Status: AC

## 2016-08-24 MED ORDER — FENTANYL CITRATE (PF) 100 MCG/2ML IJ SOLN
INTRAMUSCULAR | Status: DC | PRN
  Administered 2016-08-24: 25 ug via INTRAVENOUS

## 2016-08-24 MED ORDER — SODIUM CHLORIDE 0.9 % IV SOLN: 250.0000 mL | INTRAVENOUS | Status: DC | PRN

## 2016-08-24 MED ORDER — LIDOCAINE HCL (PF) 1 % IJ SOLN
INTRAMUSCULAR | Status: DC | PRN
  Administered 2016-08-24: 2 mL

## 2016-08-24 MED ORDER — VERAPAMIL HCL 2.5 MG/ML IV SOLN
INTRAVENOUS | Status: DC | PRN
  Administered 2016-08-24: 10 mL via INTRA_ARTERIAL

## 2016-08-24 SURGICAL SUPPLY — 11 items
CATH INFINITI 5 FR JL3.5 (CATHETERS) ×3 IMPLANT
CATH INFINITI 5 FR STR PIGTAIL (CATHETERS) ×3 IMPLANT
CATH INFINITI JR4 5F (CATHETERS) ×3 IMPLANT
DEVICE RAD COMP TR BAND LRG (VASCULAR PRODUCTS) ×3 IMPLANT
GLIDESHEATH SLEND SS 6F .021 (SHEATH) ×3 IMPLANT
KIT HEART LEFT (KITS) ×3 IMPLANT
PACK CARDIAC CATHETERIZATION (CUSTOM PROCEDURE TRAY) ×3 IMPLANT
SYR MEDRAD MARK V 150ML (SYRINGE) ×3 IMPLANT
TRANSDUCER W/STOPCOCK (MISCELLANEOUS) ×3 IMPLANT
TUBING CIL FLEX 10 FLL-RA (TUBING) ×3 IMPLANT
WIRE SAFE-T 1.5MM-J .035X260CM (WIRE) ×3 IMPLANT

## 2016-08-24 NOTE — Discharge Instructions (Signed)
Follow with Primary MD No PCP Per Patient in 7 days  ° °Get CBC, CMP, 2 view Chest X ray checked  by Primary MD or SNF MD in 5-7 days ( we routinely change or add medications that can affect your baseline labs and fluid status, therefore we recommend that you get the mentioned basic workup next visit with your PCP, your PCP may decide not to get them or add new tests based on their clinical decision) ° °Activity: As tolerated with Full fall precautions use walker/cane & assistance as needed ° °Disposition Home  ° °Diet:  Heart Healthy  ° °For Heart failure patients - Check your Weight same time everyday, if you gain over 2 pounds, or you develop in leg swelling, experience more shortness of breath or chest pain, call your Primary MD immediately. Follow Cardiac Low Salt Diet and 1.5 lit/day fluid restriction. ° °On your next visit with your primary care physician please Get Medicines reviewed and adjusted. ° °Please request your Prim.MD to go over all Hospital Tests and Procedure/Radiological results at the follow up, please get all Hospital records sent to your Prim MD by signing hospital release before you go home. ° °If you experience worsening of your admission symptoms, develop shortness of breath, life threatening emergency, suicidal or homicidal thoughts you must seek medical attention immediately by calling 911 or calling your MD immediately  if symptoms less severe. ° °You Must read complete instructions/literature along with all the possible adverse reactions/side effects for all the Medicines you take and that have been prescribed to you. Take any new Medicines after you have completely understood and accpet all the possible adverse reactions/side effects.  ° °Do not drive, operate heavy machinery, perform activities at heights, swimming or participation in water activities or provide baby sitting services if your were admitted for syncope or siezures until you have seen by Primary MD or a Neurologist and  advised to do so again. ° °Do not drive when taking Pain medications.  ° ° °Do not take more than prescribed Pain, Sleep and Anxiety Medications ° °Special Instructions: If you have smoked or chewed Tobacco  in the last 2 yrs please stop smoking, stop any regular Alcohol  and or any Recreational drug use. ° °Wear Seat belts while driving. ° ° °Please note ° °You were cared for by a hospitalist during your hospital stay. If you have any questions about your discharge medications or the care you received while you were in the hospital after you are discharged, you can call the unit and asked to speak with the hospitalist on call if the hospitalist that took care of you is not available. Once you are discharged, your primary care physician will handle any further medical issues. Please note that NO REFILLS for any discharge medications will be authorized once you are discharged, as it is imperative that you return to your primary care physician (or establish a relationship with a primary care physician if you do not have one) for your aftercare needs so that they can reassess your need for medications and monitor your lab values. ° °

## 2016-08-24 NOTE — Progress Notes (Signed)
ANTICOAGULATION CONSULT NOTE  Pharmacy Consult for heparin Indication: chest pain/ACS  No Known Allergies  Patient Measurements: Height: 5\' 11"  (180.3 cm) Weight: 134 lb (60.8 kg) IBW/kg (Calculated) : 75.3 Heparin Dosing Weight: 65.8 kg  Vital Signs: Temp: 98.5 F (36.9 C) (09/18 0425) Temp Source: Oral (09/18 0425) BP: 104/67 (09/18 0425) Pulse Rate: 59 (09/18 0425)  Labs:  Recent Labs  08/23/16 0732 08/23/16 0942 08/23/16 1537 08/23/16 1803 08/23/16 2300 08/24/16 0517  HGB 13.7  --   --   --   --  14.3  HCT 40.1  --   --   --   --  42.3  PLT 197  --   --   --   --  221  APTT 24  --   --   --   --   --   LABPROT 13.1  --   --   --   --   --   INR 0.99  --   --   --   --   --   HEPARINUNFRC  --   --   --  0.33 0.30 0.30  CREATININE 0.98  --   --   --   --   --   TROPONINI 0.03* 0.20* 0.15*  --  0.06*  --     Estimated Creatinine Clearance: 76.7 mL/min (by C-G formula based on SCr of 0.98 mg/dL).  . heparin 900 Units/hr (08/24/16 0000)    Medical History: History reviewed. No pertinent past medical history.  Medications:  No meds PTA  Assessment: 50 yo man with CP started on IV heparin.  Heparin level is at goal and troponin trending down.  Goal of Therapy:  Heparin level 0.3-0.7 units/ml Monitor platelets by anticoagulation protocol: Yes   Plan:  Continue IV heparin at current rate. Daily heparin level and CBC. F/u plans for further cardiac workup.  Harland GermanAndrew Vaughn Frieze, Pharm D 08/24/2016 8:11 AM

## 2016-08-24 NOTE — Care Management Note (Signed)
Case Management Note  Patient Details  Name: Randall HissJay Tissue MRN: [id number] Date of Birth: August 10, 1966  Subjective/Objective:  Pt presented for chest pain-Nstemi. Pt initiated on IV heparin gtt. Plan for cardiac cath. Pt states he does work, however does not have insurance. Financial Counselor is working with the patient. Pt has no PCP at this time. [facility] in the past and wants to utilize again.   Action/Plan: CM did call the [facility] to make an appointment. Information for Hospital F/u placed on AVS- Jerene BearsJanet Barret will be able to assist with medications if needed. CM did make pt aware of [facility] $4.00 medication list. Pt will need all generics once stable for d/c. CM will continue to monitor.   Expected Discharge Date:                  Expected Discharge Plan:  Home/Self Care  In-House Referral:  Financial Counselor  Discharge planning Services  CM Consult, Follow-up appt scheduled, [facility]  Post Acute Care Choice:  NA Choice offered to:  NA  DME Arranged:  N/A DME Agency:  NA  HH Arranged:  NA HH Agency:  NA  Status of Service:  Completed, signed off  If discussed at Long Length of Stay Meetings, dates discussed:    Additional Comments:  Gala LewandowskyGraves-Bigelow, Lawonda Pretlow Kaye, RN 08/24/2016, 11:12 AM

## 2016-08-24 NOTE — Progress Notes (Signed)
PROGRESS NOTE                                                                                                                                                                                                             Patient Demographics:    Adam Mercado, is a 50 y.o. male, DOB - [birth year], WJX:[id number]  Admit date - 08/23/2016   Admitting Physician Drema Dallas, MD  Outpatient Primary MD for the patient is No PCP Per Patient  LOS - 1  Chief Complaint  Patient presents with  . Chest Pain       Brief Narrative Adam Mercado is a 50 y.o. male with Past medical history of substance abuse with cocaine and marijuana.Admitted for non-STEMI.   Subjective:    Adam Mercado today has, No headache, No chest pain, No abdominal pain - No Nausea, No new weakness tingling or numbness, No Cough - SOB.     Assessment  & Plan :     1.Chest pain, questionable NSTEMI in a patient with active cocaine abuse. Currently chest pain-free, counseled to quit cocaine use and smoking, continue aspirin and statin for secondary prevention, cardiology on board to for left heart cath.  2. Smoking and cocaine abuse. Counseled to quit both.  Family Communication  :  none  Code Status :  Full  Diet : Heart healthy  Disposition Plan  :    Consults  :  Cardw  Procedures  :    L heart Cath  DVT Prophylaxis  :    Heparin    Lab Results  Component Value Date   PLT 221 08/24/2016    Inpatient Medications  Scheduled Meds: . aspirin EC  81 mg Oral Daily  . atorvastatin  40 mg Oral q1800  . nicotine  7 mg Transdermal Daily  . sodium chloride flush  3 mL Intravenous Q12H   Continuous Infusions: . [START ON 08/25/2016] sodium chloride 3 mL/kg/hr (08/24/16 1001)   Followed by  . [START ON 08/25/2016] sodium chloride    . heparin 900 Units/hr (08/24/16 0000)   PRN Meds:.sodium chloride, acetaminophen, nitroGLYCERIN, ondansetron  (ZOFRAN) IV, sodium chloride flush  Antibiotics  :    Anti-infectives    None         Objective:   Vitals:   08/23/16 1348 08/23/16 1442 08/23/16 2019  08/24/16 0425  BP: 108/69 124/78 116/61 104/67  Pulse: 68 (!) 56 87 (!) 59  Resp: 19 18 16 15   Temp: 98.2 F (36.8 C) 98.1 F (36.7 C) 99 F (37.2 C) 98.5 F (36.9 C)  TempSrc: Oral Oral Oral Oral  SpO2: 100%  98% 99%  Weight:  60.5 kg (133 lb 4.8 oz)  60.8 kg (134 lb)  Height:  5\' 11"  (1.803 m)      Wt Readings from Last 3 Encounters:  08/24/16 60.8 kg (134 lb)  04/21/15 66.8 kg (147 lb 6 oz)     Intake/Output Summary (Last 24 hours) at 08/24/16 1056 Last data filed at 08/24/16 0000  Gross per 24 hour  Intake              285 ml  Output              400 ml  Net             -115 ml     Physical Exam  Awake Alert, Oriented X 3, No new F.N deficits, Normal affect [place].AT,PERRAL Supple Neck,No JVD, No cervical lymphadenopathy appriciated.  Symmetrical Chest wall movement, Good air movement bilaterally, CTAB RRR,No Gallops,Rubs or new Murmurs, No Parasternal Heave +ve B.Sounds, Abd Soft, No tenderness, No organomegaly appriciated, No rebound - guarding or rigidity. No Cyanosis, Clubbing or edema, No new Rash or bruise      Data Review:    CBC  Recent Labs Lab 08/23/16 0732 08/24/16 0517  WBC 11.1* 8.8  HGB 13.7 14.3  HCT 40.1 42.3  PLT 197 221  MCV 95.9 97.2  MCH 32.8 32.9  MCHC 34.2 33.8  RDW 12.2 12.0    Chemistries   Recent Labs Lab 08/23/16 0732  NA 136  K 4.3  CL 108  CO2 26  GLUCOSE 106*  BUN 14  CREATININE 0.98  CALCIUM 9.0  MG 2.0  AST 37  ALT 35  ALKPHOS 83  BILITOT 1.4*   ------------------------------------------------------------------------------------------------------------------  Recent Labs  08/24/16 0517  CHOL 213*  HDL 77  LDLCALC 127*  TRIG 47  CHOLHDL 2.8    No results found for:  HGBA1C ------------------------------------------------------------------------------------------------------------------  Recent Labs  08/23/16 0732  TSH 3.150   ------------------------------------------------------------------------------------------------------------------ No results for input(s): VITAMINB12, FOLATE, FERRITIN, TIBC, IRON, RETICCTPCT in the last 72 hours.  Coagulation profile  Recent Labs Lab 08/23/16 0732 08/24/16 0906  INR 0.99 0.98    No results for input(s): DDIMER in the last 72 hours.  Cardiac Enzymes  Recent Labs Lab 08/23/16 0942 08/23/16 1537 08/23/16 2300  TROPONINI 0.20* 0.15* 0.06*   ------------------------------------------------------------------------------------------------------------------    Component Value Date/Time   BNP 23.0 08/23/2016 [id number]    Micro Results No results found for this or any previous visit (from the past 240 hour(s)).  Radiology Reports Dg Chest 2 View  Result Date: 08/23/2016 CLINICAL DATA:  Patient with mid chest pain 1 hour ago. Shortness of breath. EXAM: CHEST  2 VIEW COMPARISON:  None. FINDINGS: The heart size and mediastinal contours are within normal limits. Both lungs are clear. The visualized skeletal structures are unremarkable. IMPRESSION: No active cardiopulmonary disease. Electronically Signed   By: Annia Beltrew  Davis M.D.   On: 08/23/2016 07:45    Time Spent in minutes  30   SINGH,PRASHANT K M.D on 08/24/2016 at 10:56 AM  Between 7am to 7pm - Pager - [phone]  After 7pm go to [email or web address] - password [email or web address]  [facility] -  Office  [phone]

## 2016-08-24 NOTE — Interval H&P Note (Signed)
History and Physical Interval Note:  08/24/2016 2:51 PM  Adam Mercado  has presented today for surgery, with the diagnosis of cp  The various methods of treatment have been discussed with the patient and family. After consideration of risks, benefits and other options for treatment, the patient has consented to  Procedure(s): Left Heart Cath and Coronary Angiography (N/A) as a surgical intervention .  The patient's history has been reviewed, patient examined, no change in status, stable for surgery.  I have reviewed the patient's chart and labs.  Questions were answered to the patient's satisfaction.   Cath Lab Visit (complete for each Cath Lab visit)  Clinical Evaluation Leading to the Procedure:   ACS: Yes.    Non-ACS:    Anginal Classification: CCS IV  Anti-ischemic medical therapy: No Therapy  Non-Invasive Test Results: No non-invasive testing performed  Prior CABG: No previous CABG        Theron Aristaeter [facility] Inc[facility]MD,FACC 08/24/2016 2:51 PM

## 2016-08-24 NOTE — H&P (View-Only) (Signed)
50 yr old m with several episodes of chest pressure at work. +FH- father had a stroke on cath table.,+ tobacco,   Subjective: No further pain   Objective: Vital signs in last 24 hours: Temp:  [97.8 F (36.6 C)-99 F (37.2 C)] 98.5 F (36.9 C) (09/18 0425) Pulse Rate:  [56-87] 59 (09/18 0425) Resp:  [15-22] 15 (09/18 0425) BP: (92-124)/(55-80) 104/67 (09/18 0425) SpO2:  [98 %-100 %] 99 % (09/18 0425) Weight:  [133 lb 4.8 oz (60.5 kg)-134 lb (60.8 kg)] 134 lb (60.8 kg) (09/18 0425) Weight change:  Last BM Date: 08/22/16 Intake/Output from previous day: 09/17 0701 - 09/18 0700 In: 285 [P.O.:240; I.V.:45] Out: 400 [Urine:400] Intake/Output this shift: No intake/output data recorded.  PE: General:Pleasant affect, NAD Skin:Warm and dry, brisk capillary refill HEENT:normocephalic, sclera clear, mucus membranes moist Neck:supple, no JVD Heart:S1S2 RRR without murmur, gallup, rub or click Lungs:clear without rales, rhonchi, or wheezes ZOX:WRUEAbd:soft, non tender, + BS, do not palpate liver spleen or masses Ext:no lower ext edema, 2+ pedal pulses, 2+ radial pulses Neuro:alert and oriented X 3, MAE, follows commands, + facial symmetry  Lab Results:  Recent Labs  08/23/16 0732 08/24/16 0517  WBC 11.1* 8.8  HGB 13.7 14.3  HCT 40.1 42.3  PLT 197 221   BMET  Recent Labs  08/23/16 0732  NA 136  K 4.3  CL 108  CO2 26  GLUCOSE 106*  BUN 14  CREATININE 0.98  CALCIUM 9.0    Recent Labs  08/23/16 1537 08/23/16 2300  TROPONINI 0.15* 0.06*    Lab Results  Component Value Date   CHOL 213 (H) 08/24/2016   HDL 77 08/24/2016   LDLCALC 127 (H) 08/24/2016   TRIG 47 08/24/2016   CHOLHDL 2.8 08/24/2016   No results found for: HGBA1C   Lab Results  Component Value Date   TSH 3.150 08/23/2016    Hepatic Function Panel  Recent Labs  08/23/16 0732  PROT 7.4  ALBUMIN 4.2  AST 37  ALT 35  ALKPHOS 83  BILITOT 1.4*  BILIDIR 0.2  IBILI 1.2*    Recent  Labs  08/24/16 0517  CHOL 213*   No results for input(s): PROTIME in the last 72 hours.  Lipid Panel     Component Value Date/Time   CHOL 213 (H) 08/24/2016 0517   TRIG 47 08/24/2016 0517   HDL 77 08/24/2016 0517   CHOLHDL 2.8 08/24/2016 0517   VLDL 9 08/24/2016 0517   LDLCALC 127 (H) 08/24/2016 0517         Studies/Results: Dg Chest 2 View  Result Date: 08/23/2016 CLINICAL DATA:  Patient with mid chest pain 1 hour ago. Shortness of breath. EXAM: CHEST  2 VIEW COMPARISON:  None. FINDINGS: The heart size and mediastinal contours are within normal limits. Both lungs are clear. The visualized skeletal structures are unremarkable. IMPRESSION: No active cardiopulmonary disease. Electronically Signed   By: Annia Beltrew  Davis M.D.   On: 08/23/2016 07:45   ECHO: Study Conclusions  - Left ventricle: The cavity size was normal. There was mild   concentric hypertrophy. Systolic function was normal. The   estimated ejection fraction was in the range of 60% to 65%. Wall   motion was normal; there were no regional wall motion   abnormalities. Left ventricular diastolic function parameters   were normal. - Aortic valve: Trileaflet; normal thickness leaflets. There was no   regurgitation. - Aortic root: The aortic root was normal in size. -  Mitral valve: There was no regurgitation. - Left atrium: The atrium was normal in size. - Right ventricle: Systolic function was normal. - Right atrium: The atrium was normal in size. - Pulmonary arteries: Systolic pressure was within the normal   range. - Inferior vena cava: The vessel was normal in size. - Pericardium, extracardiac: There was no pericardial effusion.   Medications: I have reviewed the patient's current medications. Scheduled Meds: . aspirin EC  81 mg Oral Daily  . atorvastatin  40 mg Oral q1800  . nicotine  7 mg Transdermal Daily   Continuous Infusions: . heparin 900 Units/hr (08/24/16 0000)   PRN Meds:.acetaminophen,  nitroGLYCERIN, ondansetron (ZOFRAN) IV  Assessment/Plan:  1. NSTEMI - possibly cocaine induced vasospasm though he had pain prior to his use of cocaine, we will trend troponin, if continues to rise, we will schedule a cath in the am, otherwise a Lexiscan stress test (exercise contraindicated 48 hours post MI), pt is NPO- discussed  we will continue Heparin iv, asa 81 mg po daily, atorvastatin 40 mg po daily for now. troponin  0.03; 0.20; 0.15; 0.06  We discussed cardiac cath- pt stated his father had major stroke on cath table and he did not want that if possible.  Will discuss but most likely lexiscan myoview but Dr. Bishop Limbo will see   2. Cocaine abuse - consulted  3. Smoking abuse - consulted  4.  Hyperlipidemia with LDL 127, T chol 213, TG 47, HDL 77 on statin.     LOS: 1 day   Time spent with pt. : 15 minutes. Nada Boozer  Nurse Practitioner Certified Pager [phone] or after 5pm and on weekends call [phone] 08/24/2016, 7:47 AM  Patient seen and examined. Agree with assessment and plan. Pt admits to a several week history of significant exertional dyspnea. He has a 35 year history of tobacco abuse, elevated lipids and family history for CAD. He has used cocaine on weekends. He presented with significant chest pressure and dyspnea; troponin is mildly elevated and is normalizing. Echo reveals normal LV fxn witjhout wall motion abnormality. ECG reveals sinus rhythm with LVH. I discussed options in detail including definitive cardiac catheterization vs nuclear imaging. After much discussion will pursue definitive cardiac cath. I have reviewed the risks, indications, and alternatives to cardiac catheterization, possible angioplasty, and stenting with the patient. Risks include but are not limited to bleeding, infection, vascular injury, stroke, myocardial infection, arrhythmia, kidney injury, radiation-related injury in the case of prolonged fluoroscopy use, emergency cardiac surgery, and  death. The patient understands the risks of serious complication is 1-2 in 1000 with diagnostic cardiac cath and 1-2% or less with angioplasty/stenting.    Lennette Bihari, MD, [facility] 08/24/2016 8:43 AM

## 2016-08-24 NOTE — Discharge Summary (Signed)
Adam Mercado:[id number] DOB: 10/28/1966 DOA: 08/23/2016  PCP: No PCP Per Patient  Admit date: 08/23/2016  Discharge date: 08/24/2016  Admitted From: Home   Disposition:  Home   Recommendations for Outpatient Follow-up:   Follow up with PCP in 1-2 weeks  PCP Please obtain BMP/CBC, 2 view CXR in 1week,  (see Discharge instructions)   PCP Please follow up on the following pending results: None   Home Health: None   Equipment/Devices: None  Consultations: cards Discharge Condition: Stable   CODE STATUS: Full   Diet Recommendation: Heart healthy   Chief Complaint  Patient presents with  . Chest Pain     Brief history of present illness from the day of admission and additional interim summary     Adam Mercado a 50 y.o.malewith Past medical history of substance abuse with cocaine and marijuana.Admitted for non-STEMI.  Hospital issues addressed     1.Chest pain, questionable NSTEMI in a patient with active cocaine abuse. Currently chest pain-free, counseled to quit cocaine use and smoking, continue aspirin for secondary prevention, cardiology on board Underwent echocardiogram and left heart caths both of which were unremarkable, patient has been counseled on quitting smoking and cocaine, request to follow with local free clinic for secondary risk factor modulation in the outpatient setting.  2. Smoking and cocaine abuse. Counseled to quit both.   Discharge diagnosis     Principal Problem:   Unstable angina (HCC) Active Problems:   Cocaine abuse   Marijuana abuse   Smoker   Elevated troponin   NSTEMI (non-ST elevated myocardial infarction) Adam Mercado)    Discharge instructions    Discharge Instructions    Diet - low sodium heart healthy    Complete by:  As directed    Discharge instructions     Complete by:  As directed    Follow with Primary MD No PCP Per Patient in 7 days   Get CBC, CMP, 2 view Chest X ray checked  by Primary MD or SNF MD in 5-7 days ( we routinely change or add medications that can affect your baseline labs and fluid status, therefore we recommend that you get the mentioned basic workup next visit with your PCP, your PCP may decide not to get them or add new tests based on their clinical decision)   Activity: As tolerated with Full fall precautions use walker/cane & assistance as needed   Disposition Home     Diet:   Heart Healthy    For Heart failure patients - Check your Weight same time everyday, if you gain over 2 pounds, or you develop in leg swelling, experience more shortness of breath or chest pain, call your Primary MD immediately. Follow Cardiac Low Salt Diet and 1.5 lit/day fluid restriction.   On your next visit with your primary care physician please Get Medicines reviewed and adjusted.   Please request your Prim.MD to go over all Hospital Tests and Procedure/Radiological results at the follow up, please get all Hospital records sent to your Prim MD by  signing hospital release before you go home.   If you experience worsening of your admission symptoms, develop shortness of breath, life threatening emergency, suicidal or homicidal thoughts you must seek medical attention immediately by calling 911 or calling your MD immediately  if symptoms less severe.  You Must read complete instructions/literature along with all the possible adverse reactions/side effects for all the Medicines you take and that have been prescribed to you. Take any new Medicines after you have completely understood and accpet all the possible adverse reactions/side effects.   Do not drive, operate heavy machinery, perform activities at heights, swimming or participation in water activities or provide baby sitting services if your were admitted for syncope or siezures until  you have seen by Primary MD or a Neurologist and advised to do so again.  Do not drive when taking Pain medications.    Do not take more than prescribed Pain, Sleep and Anxiety Medications  Special Instructions: If you have smoked or chewed Tobacco  in the last 2 yrs please stop smoking, stop any regular Alcohol  and or any Recreational drug use.  Wear Seat belts while driving.   Please note  You were cared for by a hospitalist during your hospital stay. If you have any questions about your discharge medications or the care you received while you were in the hospital after you are discharged, you can call the unit and asked to speak with the hospitalist on call if the hospitalist that took care of you is not available. Once you are discharged, your primary care physician will handle any further medical issues. Please note that NO REFILLS for any discharge medications will be authorized once you are discharged, as it is imperative that you return to your primary care physician (or establish a relationship with a primary care physician if you do not have one) for your aftercare needs so that they can reassess your need for medications and monitor your lab values.   Increase activity slowly    Complete by:  As directed       Discharge Medications     Medication List    TAKE these medications   aspirin 81 MG EC tablet Take 1 tablet (81 mg total) by mouth daily. Start taking on:  08/25/2016       Follow-up Information    [employer] [facility] on 09/02/2016.   Why:  [facility] 10:00 [facility] Please bring photo ID and proof of income for everyone in househlold that works. Health Dept has a person onsite that will assist with medications if needed. Thanks Contact information: [address] [place] [address] [phone]           Major procedures and Radiology Reports - PLEASE review detailed and final reports thoroughly  -     TTE  Left  ventricle: The cavity size was normal. There was mild   concentric hypertrophy. Systolic function was normal. The   estimated ejection fraction was in the range of 60% to 65%. Wall   motion was normal; there were no regional wall motion   abnormalities. Left ventricular diastolic function parameters   were normal. - Aortic valve: Trileaflet; normal thickness leaflets. There was no   regurgitation. - Aortic root: The aortic root was normal in size. - Mitral valve: There was no regurgitation. - Left atrium: The atrium was normal in size. - Right ventricle: Systolic function was normal. - Right atrium: The atrium  was normal in size. - Pulmonary arteries: Systolic pressure was within the normal   range. - Inferior vena cava: The vessel was normal in size. 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 Shortness of breath. EXAM: CHEST  2 VIEW COMPARISON:  None. FINDINGS: The heart size and mediastinal contours are within normal limits. Both lungs are clear. The visualized skeletal structures are unremarkable. IMPRESSION: No active cardiopulmonary disease. Electronically Signed   By: Annia Belt M.D.   On: 08/23/2016 07:45    Micro Results    No results found for this or any previous visit (from the past 240 hour(s)).  Today   Subjective    Adam Mercado today has no headache,no chest abdominal pain,no new weakness tingling or numbness, feels much better wants to go home today.    Objective   Blood pressure 129/88, pulse 61, temperature 98.4 F (36.9 C), temperature source Oral, resp. rate (!) 24, height 5\' 11"  (1.803 m), weight 60.8 kg (134 lb), SpO2 100 %.   Intake/Output Summary (Last 24 hours) at 08/24/16 1739 Last data filed at 08/24/16 1100  Gross per 24 hour  Intake              285 ml  Output              700 ml  Net              -415 ml    Exam Awake Alert, Oriented x 3, No new F.N deficits, Normal affect [place].AT,PERRAL Supple Neck,No JVD, No cervical lymphadenopathy appriciated.  Symmetrical Chest wall movement, Good air movement bilaterally, CTAB RRR,No Gallops,Rubs or new Murmurs, No Parasternal Heave +ve B.Sounds, Abd Soft, Non tender, No organomegaly appriciated, No rebound -guarding or rigidity. No Cyanosis, Clubbing or edema, No new Rash or bruise   Data Review   CBC w Diff: Lab Results  Component Value Date   WBC 8.8 08/24/2016   HGB 14.3 08/24/2016   HCT 42.3 08/24/2016   PLT 221 08/24/2016    CMP: Lab Results  Component Value Date   NA 139 08/24/2016   K 4.3 08/24/2016   CL 107 08/24/2016   CO2 24 08/24/2016   BUN 12 08/24/2016   CREATININE 1.09 08/24/2016   PROT 7.4 08/23/2016   ALBUMIN 4.2 08/23/2016   BILITOT 1.4 (H) 08/23/2016   ALKPHOS 83 08/23/2016   AST 37 08/23/2016   ALT 35 08/23/2016  .   Total Time in preparing paper work, data evaluation and todays exam - 35 minutes  Leroy Sea M.D on 08/24/2016 at 5:39 PM  [facility]   Office  [phone]

## 2016-08-24 NOTE — Progress Notes (Addendum)
50 yr old m with several episodes of chest pressure at work. +FH- father had a stroke on cath table.,+ tobacco,   Subjective: No further pain   Objective: Vital signs in last 24 hours: Temp:  [97.8 F (36.6 C)-99 F (37.2 C)] 98.5 F (36.9 C) (09/18 0425) Pulse Rate:  [56-87] 59 (09/18 0425) Resp:  [15-22] 15 (09/18 0425) BP: (92-124)/(55-80) 104/67 (09/18 0425) SpO2:  [98 %-100 %] 99 % (09/18 0425) Weight:  [133 lb 4.8 oz (60.5 kg)-134 lb (60.8 kg)] 134 lb (60.8 kg) (09/18 0425) Weight change:  Last BM Date: 08/22/16 Intake/Output from previous day: 09/17 0701 - 09/18 0700 In: 285 [P.O.:240; I.V.:45] Out: 400 [Urine:400] Intake/Output this shift: No intake/output data recorded.  PE: General:Pleasant affect, NAD Skin:Warm and dry, brisk capillary refill HEENT:normocephalic, sclera clear, mucus membranes moist Neck:supple, no JVD Heart:S1S2 RRR without murmur, gallup, rub or click Lungs:clear without rales, rhonchi, or wheezes ZOX:WRUEAbd:soft, non tender, + BS, do not palpate liver spleen or masses Ext:no lower ext edema, 2+ pedal pulses, 2+ radial pulses Neuro:alert and oriented X 3, MAE, follows commands, + facial symmetry  Lab Results:  Recent Labs  08/23/16 0732 08/24/16 0517  WBC 11.1* 8.8  HGB 13.7 14.3  HCT 40.1 42.3  PLT 197 221   BMET  Recent Labs  08/23/16 0732  NA 136  K 4.3  CL 108  CO2 26  GLUCOSE 106*  BUN 14  CREATININE 0.98  CALCIUM 9.0    Recent Labs  08/23/16 1537 08/23/16 2300  TROPONINI 0.15* 0.06*    Lab Results  Component Value Date   CHOL 213 (H) 08/24/2016   HDL 77 08/24/2016   LDLCALC 127 (H) 08/24/2016   TRIG 47 08/24/2016   CHOLHDL 2.8 08/24/2016   No results found for: HGBA1C   Lab Results  Component Value Date   TSH 3.150 08/23/2016    Hepatic Function Panel  Recent Labs  08/23/16 0732  PROT 7.4  ALBUMIN 4.2  AST 37  ALT 35  ALKPHOS 83  BILITOT 1.4*  BILIDIR 0.2  IBILI 1.2*    Recent  Labs  08/24/16 0517  CHOL 213*   No results for input(s): PROTIME in the last 72 hours.  Lipid Panel     Component Value Date/Time   CHOL 213 (H) 08/24/2016 0517   TRIG 47 08/24/2016 0517   HDL 77 08/24/2016 0517   CHOLHDL 2.8 08/24/2016 0517   VLDL 9 08/24/2016 0517   LDLCALC 127 (H) 08/24/2016 0517         Studies/Results: Dg Chest 2 View  Result Date: 08/23/2016 CLINICAL DATA:  Patient with mid chest pain 1 hour ago. Shortness of breath. EXAM: CHEST  2 VIEW COMPARISON:  None. FINDINGS: The heart size and mediastinal contours are within normal limits. Both lungs are clear. The visualized skeletal structures are unremarkable. IMPRESSION: No active cardiopulmonary disease. Electronically Signed   By: Annia Beltrew  Davis M.D.   On: 08/23/2016 07:45   ECHO: Study Conclusions  - Left ventricle: The cavity size was normal. There was mild   concentric hypertrophy. Systolic function was normal. The   estimated ejection fraction was in the range of 60% to 65%. Wall   motion was normal; there were no regional wall motion   abnormalities. Left ventricular diastolic function parameters   were normal. - Aortic valve: Trileaflet; normal thickness leaflets. There was no   regurgitation. - Aortic root: The aortic root was normal in size. -  Mitral valve: There was no regurgitation. - Left atrium: The atrium was normal in size. - Right ventricle: Systolic function was normal. - Right atrium: The atrium was normal in size. - Pulmonary arteries: Systolic pressure was within the normal   range. - Inferior vena cava: The vessel was normal in size. - Pericardium, extracardiac: There was no pericardial effusion.   Medications: I have reviewed the patient's current medications. Scheduled Meds: . aspirin EC  81 mg Oral Daily  . atorvastatin  40 mg Oral q1800  . nicotine  7 mg Transdermal Daily   Continuous Infusions: . heparin 900 Units/hr (08/24/16 0000)   PRN Meds:.acetaminophen,  nitroGLYCERIN, ondansetron (ZOFRAN) IV  Assessment/Plan:  1. NSTEMI - possibly cocaine induced vasospasm though he had pain prior to his use of cocaine, we will trend troponin, if continues to rise, we will schedule a cath in the am, otherwise a Lexiscan stress test (exercise contraindicated 48 hours post MI), pt is NPO- discussed  we will continue Heparin iv, asa 81 mg po daily, atorvastatin 40 mg po daily for now. troponin  0.03; 0.20; 0.15; 0.06  We discussed cardiac cath- pt stated his father had major stroke on cath table and he did not want that if possible.  Will discuss but most likely lexiscan myoview but Dr. Bishop Limbo will see   2. Cocaine abuse - consulted  3. Smoking abuse - consulted  4.  Hyperlipidemia with LDL 127, T chol 213, TG 47, HDL 77 on statin.     LOS: 1 day   Time spent with pt. : 15 minutes. Nada Boozer  Nurse Practitioner Certified Pager [phone] or after 5pm and on weekends call [phone] 08/24/2016, 7:47 AM  Patient seen and examined. Agree with assessment and plan. Pt admits to a several week history of significant exertional dyspnea. He has a 35 year history of tobacco abuse, elevated lipids and family history for CAD. He has used cocaine on weekends. He presented with significant chest pressure and dyspnea; troponin is mildly elevated and is normalizing. Echo reveals normal LV fxn witjhout wall motion abnormality. ECG reveals sinus rhythm with LVH. I discussed options in detail including definitive cardiac catheterization vs nuclear imaging. After much discussion will pursue definitive cardiac cath. I have reviewed the risks, indications, and alternatives to cardiac catheterization, possible angioplasty, and stenting with the patient. Risks include but are not limited to bleeding, infection, vascular injury, stroke, myocardial infection, arrhythmia, kidney injury, radiation-related injury in the case of prolonged fluoroscopy use, emergency cardiac surgery, and  death. The patient understands the risks of serious complication is 1-2 in 1000 with diagnostic cardiac cath and 1-2% or less with angioplasty/stenting.    Lennette Bihari, MD, [facility] 08/24/2016 8:43 AM

## 2016-08-25 ENCOUNTER — Encounter (HOSPITAL_COMMUNITY): Payer: Self-pay | Admitting: Cardiology

## 2016-08-25 LAB — HEMOGLOBIN A1C
HEMOGLOBIN A1C: 5 % (ref 4.8–5.6)
MEAN PLASMA GLUCOSE: 97 mg/dL

## 2018-09-28 ENCOUNTER — Emergency Department (HOSPITAL_COMMUNITY): Payer: Self-pay

## 2018-09-28 ENCOUNTER — Encounter (HOSPITAL_COMMUNITY): Payer: Self-pay | Admitting: Emergency Medicine

## 2018-09-28 ENCOUNTER — Emergency Department (HOSPITAL_COMMUNITY)
Admission: EM | Admit: 2018-09-28 | Discharge: 2018-09-28 | Disposition: A | Discharge status: Home / Self Care | Payer: Self-pay | Attending: Emergency Medicine | Admitting: Emergency Medicine

## 2018-09-28 ENCOUNTER — Other Ambulatory Visit: Payer: Self-pay

## 2018-09-28 DIAGNOSIS — F17210 Nicotine dependence, cigarettes, uncomplicated: Secondary | ICD-10-CM | POA: Insufficient documentation

## 2018-09-28 DIAGNOSIS — R0602 Shortness of breath: Secondary | ICD-10-CM | POA: Insufficient documentation

## 2018-09-28 DIAGNOSIS — Z7982 Long term (current) use of aspirin: Secondary | ICD-10-CM | POA: Insufficient documentation

## 2018-09-28 HISTORY — DX: Respiratory tuberculosis unspecified: A15.9

## 2018-09-28 LAB — CBC WITH DIFFERENTIAL/PLATELET
ABS IMMATURE GRANULOCYTES: 0.02 10*3/uL (ref 0.00–0.07)
BASOS ABS: 0.1 10*3/uL (ref 0.0–0.1)
Basophils Relative: 1 %
EOS PCT: 1 %
Eosinophils Absolute: 0.1 10*3/uL (ref 0.0–0.5)
HEMATOCRIT: 41.3 % (ref 39.0–52.0)
HEMOGLOBIN: 13.8 g/dL (ref 13.0–17.0)
Immature Granulocytes: 0 %
LYMPHS ABS: 3.4 10*3/uL (ref 0.7–4.0)
LYMPHS PCT: 31 %
MCH: 32.5 pg (ref 26.0–34.0)
MCHC: 33.4 g/dL (ref 30.0–36.0)
MCV: 97.4 fL (ref 80.0–100.0)
Monocytes Absolute: 0.9 10*3/uL (ref 0.1–1.0)
Monocytes Relative: 8 %
NEUTROS ABS: 6.5 10*3/uL (ref 1.7–7.7)
NRBC: 0 % (ref 0.0–0.2)
Neutrophils Relative %: 59 %
Platelets: 312 10*3/uL (ref 150–400)
RBC: 4.24 MIL/uL (ref 4.22–5.81)
RDW: 11.9 % (ref 11.5–15.5)
WBC: 11 10*3/uL — AB (ref 4.0–10.5)

## 2018-09-28 LAB — BASIC METABOLIC PANEL
Anion gap: 8 (ref 5–15)
BUN: 12 mg/dL (ref 6–20)
CALCIUM: 9.1 mg/dL (ref 8.9–10.3)
CO2: 27 mmol/L (ref 22–32)
CREATININE: 0.94 mg/dL (ref 0.61–1.24)
Chloride: 104 mmol/L (ref 98–111)
Glucose, Bld: 89 mg/dL (ref 70–99)
Potassium: 3.5 mmol/L (ref 3.5–5.1)
SODIUM: 139 mmol/L (ref 135–145)

## 2018-09-28 LAB — TROPONIN I

## 2018-09-28 MED ORDER — ALBUTEROL SULFATE (2.5 MG/3ML) 0.083% IN NEBU
5.0000 mg | INHALATION_SOLUTION | Freq: Once | RESPIRATORY_TRACT | Status: AC
  Administered 2018-09-28: 5 mg via RESPIRATORY_TRACT
  Filled 2018-09-28: qty 6

## 2018-09-28 MED ORDER — ALBUTEROL SULFATE HFA 108 (90 BASE) MCG/ACT IN AERS
2.0000 | INHALATION_SPRAY | RESPIRATORY_TRACT | Status: DC | PRN
  Administered 2018-09-28: 2 via RESPIRATORY_TRACT
  Filled 2018-09-28 (×2): qty 6.7

## 2018-09-28 NOTE — ED Provider Notes (Signed)
[facility] EMERGENCY DEPARTMENT Provider Note   CSN: [id number] Arrival date & time: 09/28/18  1615     History   Chief Complaint Chief Complaint  Patient presents with  . Shortness of Breath    HPI Adam Mercado is a 52 y.o. male.  He is presenting with worsening shortness of breath for the past 2 weeks.  He said his shortness of breath is been going on a while but over the past few weeks he is becoming increasingly short of breath.  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  He is had a cough with some yellow sputum.  No fevers no chills.  He is very vague when asked about chest pain abdominal pain he says yes sometimes.  He has noted for 3 days he has had some diarrhea and at the end of the bout he had a little bit of blood.  No urinary symptoms.  He is a smoker.  No primary care doctor.  The history is provided by the patient.  Shortness of Breath  This is a new problem. The problem occurs frequently.The problem has not changed since onset.Associated symptoms include cough, sputum production, wheezing, chest pain and abdominal pain. Pertinent negatives include no fever, no headaches, no rhinorrhea, no sore throat, no neck pain, no hemoptysis, no syncope, no vomiting, no rash, no leg pain and no leg swelling. It is unknown what precipitated the problem. Risk factors include smoking. He has tried nothing for the symptoms. The treatment provided no relief.    Past Medical History:  Diagnosis Date  . Tuberculosis     Patient Active Problem List   Diagnosis Date Noted  . Unstable angina (HCC) 08/23/2016  . Cocaine abuse (HCC) 08/23/2016  . Marijuana abuse 08/23/2016  . Smoker 08/23/2016  . Elevated troponin 08/23/2016  . NSTEMI (non-ST elevated myocardial infarction) [facility])     Past Surgical History:  Procedure Laterality Date  . CARDIAC CATHETERIZATION N/A 08/24/2016   Procedure: Left Heart Cath and Coronary  Angiography;  Surgeon: Peter M Sw[place], MD;  Location: [facility] INVASIVE CV LAB;  Service: Cardiovascular;  Laterality: N/A;  . FINGER SURGERY          Home Medications    Prior to Admission medications   Medication Sig Start Date End Date Taking? Authorizing Provider  aspirin EC 81 MG EC tablet Take 1 tablet (81 mg total) by mouth daily. 08/25/16   Leroy Sea, MD    Family History Family History  Problem Relation Age of Onset  . Diabetes Brother   . Hypertension Mother   . Hypertension Father   . Stroke Father     Social History Social History   Tobacco Use  . Smoking status: Current Every Day Smoker    Packs/day: 0.50    Years: 30.00    Pack years: 15.00    Types: Cigarettes  . Smokeless tobacco: Never Used  Substance Use Topics  . Alcohol use: Yes    Alcohol/week: 80.0 standard drinks    Types: 80 Cans of beer per week    Comment: 6 pack a day  . Drug use: Yes    Types: Cocaine, Marijuana    Comment: couple of days ago     Allergies   Patient has no known allergies.   Review of Systems Review of Systems  Constitutional: Negative for fever.  HENT: Negative for rhinorrhea and sore throat.   Eyes:  Negative for visual disturbance.  Respiratory: Positive for cough, sputum production, shortness of breath and wheezing. Negative for hemoptysis.   Cardiovascular: Positive for chest pain. Negative for leg swelling and syncope.  Gastrointestinal: Positive for abdominal pain. Negative for vomiting.  Genitourinary: Negative for dysuria.  Musculoskeletal: Negative for neck pain.  Skin: Negative for rash.  Neurological: Negative for headaches.     Physical Exam Updated Vital Signs BP 127/75 (BP Location: Right Arm)   Pulse 100   Temp 97.7 F (36.5 C)   Resp (!) 26   Ht 5\' 10"  (1.778 m)   Wt 63.5 kg   SpO2 98%   BMI 20.09 kg/m   Physical Exam  Constitutional: He appears well-developed and well-nourished.  HENT:  Head: Normocephalic and atraumatic.    Mouth/Throat: Oropharynx is clear and moist.  Eyes: Pupils are equal, round, and reactive to light. Conjunctivae and EOM are normal.  Neck: Neck supple.  Cardiovascular: Normal rate and regular rhythm.  No murmur heard. Pulmonary/Chest: Effort normal and breath sounds normal. No respiratory distress.  Abdominal: Soft. There is no tenderness.  Musculoskeletal: Normal range of motion. He exhibits no edema.       Right lower leg: He exhibits no tenderness and no edema.       Left lower leg: He exhibits no tenderness and no edema.  Neurological: He is alert.  Skin: Skin is warm and dry. Capillary refill takes less than 2 seconds.  Psychiatric: He has a normal mood and affect.  Nursing note and vitals reviewed.    ED Treatments / Results  Labs (all labs ordered are listed, but only abnormal results are displayed) Labs Reviewed  CBC WITH DIFFERENTIAL/PLATELET - Abnormal; Notable for the following components:      Result Value   WBC 11.0 (*)    All other components within normal limits  BASIC METABOLIC PANEL  TROPONIN I    EKG EKG Interpretation  Date/Time:  Wednesday September 28 2018 16:25:06 EDT Ventricular Rate:  86 PR Interval:  122 QRS Duration: 102 QT Interval:  356 QTC Calculation: 426 R Axis:   76 Text Interpretation:  Normal sinus rhythm Moderate voltage criteria for LVH, may be normal variant Borderline ECG increased rate from prior 9/17 Confirmed by Meridee Score [phone]) on 09/28/2018 5:09:23 PM   Radiology Dg Chest 2 View  Result Date: 09/28/2018 CLINICAL DATA:  Shortness of breath, cough, history of TB, smoker EXAM: CHEST - 2 VIEW COMPARISON:  08/23/2016 FINDINGS: Stable hyperinflation without focal pneumonia, collapse consolidation. Negative for edema, effusion or pneumothorax. Trachea midline. Normal heart size and vascularity. Stable left inferior congenital rib deformity. Minor scoliosis of the spine. IMPRESSION: No active cardiopulmonary disease.  Electronically Signed   By: Judie Petit.  Shick M.D.   On: 09/28/2018 16:42    Procedures Procedures (including critical care time)  Medications Ordered in ED Medications  albuterol (PROVENTIL) (2.5 MG/3ML) 0.083% nebulizer solution 5 mg (5 mg Nebulization Given 09/28/18 1726)     Initial Impression / Assessment and Plan / ED Course  I have reviewed the triage vital signs and the nursing notes.  Pertinent labs & imaging results that were available during my care of the patient were reviewed by me and considered in my medical decision making (see chart for details).  Clinical Course as of Sep 29 1014  Wed Sep 28, 2018  1850 Patient get a breathing treatment here and he says he feels much better for it.  His lab work and chest x-ray were  unremarkable.  He has no insurance and no doctor so we will give him an albuterol MDI here and the numbers for the docs in the area.   [MB]    Clinical Course User Index [MB] Terrilee Files, MD    Final Clinical Impressions(s) / ED Diagnoses   Final diagnoses:  Shortness of breath    ED Discharge Orders    None       Terrilee Files, MD 09/29/18 1016

## 2018-09-28 NOTE — ED Notes (Signed)
Respiratory paged for nebulizer treatment

## 2018-09-28 NOTE — Discharge Instructions (Addendum)
You were evaluated in the emergency department for increased shortness of breath.  Your lab work EKG and chest x-ray did not show an obvious cause of your symptoms.  You felt better after breathing treatment so we will send you home with an inhaler.  You can use that 2 puffs every 4-6 hours as needed for shortness of breath.  Please work on getting a primary care doctor for further management of this.  Return if any concerns.

## 2018-09-28 NOTE — ED Triage Notes (Signed)
Patient reports SOB for the past 2 months. Productive cough with thick yellow sputum. Patient also reports rectal bleeding that started 2 days ago.

## 2018-10-11 ENCOUNTER — Ambulatory Visit: Payer: Self-pay | Admitting: Physician Assistant

## 2018-10-11 ENCOUNTER — Encounter: Payer: Self-pay | Admitting: Physician Assistant

## 2018-10-11 VITALS — BP 106/72 | HR 81 | Temp 97.5°F | Resp 18

## 2018-10-11 DIAGNOSIS — J440 Chronic obstructive pulmonary disease with acute lower respiratory infection: Secondary | ICD-10-CM

## 2018-10-11 DIAGNOSIS — J209 Acute bronchitis, unspecified: Secondary | ICD-10-CM

## 2018-10-11 DIAGNOSIS — F17200 Nicotine dependence, unspecified, uncomplicated: Secondary | ICD-10-CM

## 2018-10-11 DIAGNOSIS — Z7689 Persons encountering health services in other specified circumstances: Secondary | ICD-10-CM

## 2018-10-11 DIAGNOSIS — R062 Wheezing: Secondary | ICD-10-CM

## 2018-10-11 DIAGNOSIS — E785 Hyperlipidemia, unspecified: Secondary | ICD-10-CM

## 2018-10-11 MED ORDER — AZITHROMYCIN 250 MG PO TABS: ORAL_TABLET | ORAL | 0 refills | Status: DC

## 2018-10-11 MED ORDER — PREDNISONE 20 MG PO TABS: 20.0000 mg | ORAL_TABLET | Freq: Two times a day (BID) | ORAL | 0 refills | Status: DC

## 2018-10-11 MED ORDER — ALBUTEROL SULFATE (2.5 MG/3ML) 0.083% IN NEBU
2.5000 mg | INHALATION_SOLUTION | Freq: Once | RESPIRATORY_TRACT | Status: AC
  Administered 2018-10-11: 2.5 mg via RESPIRATORY_TRACT

## 2018-10-11 NOTE — Progress Notes (Signed)
BP 106/72   Pulse 81   Temp (!) 97.5 F (36.4 C)   Resp 18   SpO2 95%    Subjective:    Patient ID: Adam Mercado, male    DOB: [birth year], 52 y.o.   MRN: [id number]  HPI: Adam Mercado is a 52 y.o. male presenting on 10/11/2018 for New Patient (Initial Visit)   HPI   Chief Complaint  Patient presents with  . New Patient (Initial Visit)    Pt was Seen in ER 2 wk ago with SOB.  Pt is a smoker with PMH MI, TB,  hx cocaine , MJ and heavy etoh..  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  Pt denies recent drug use.  CXR report from 09/28/18 reviewed.  He is still having sob.  he Says the inhaler he was given helps "a little bit".  While walking to appointment today, he had to stop 5 or 6 times to catch his breath.   He says he has No sob at rest.   Relevant past medical, surgical, family and social history reviewed and updated as indicated. Interim medical history since our last visit reviewed. Allergies and medications reviewed and updated.   Current Outpatient Medications:  .  albuterol (PROVENTIL HFA;VENTOLIN HFA) 108 (90 Base) MCG/ACT inhaler, Inhale into the lungs every 6 (six) hours as needed for wheezing or shortness of breath., Disp: , Rfl:  .  aspirin EC 81 MG EC tablet, Take 1 tablet (81 mg total) by mouth daily. (Patient not taking: Reported on 10/11/2018), Disp: 30 tablet, Rfl: 0   Review of Systems  Constitutional: Negative for appetite change, chills, diaphoresis, fatigue, fever and unexpected weight change.  HENT: Negative for congestion, dental problem, drooling, ear pain, facial swelling, hearing loss, mouth sores, sneezing, sore throat, trouble swallowing and voice change.   Eyes: Negative for pain, discharge, redness, itching and visual disturbance.  Respiratory: Positive for cough, shortness of breath and wheezing. Negative for choking.   Cardiovascular: Positive for chest pain. Negative for palpitations and leg swelling.   Gastrointestinal: Negative for abdominal pain, blood in stool, constipation, diarrhea and vomiting.  Endocrine: Negative for cold intolerance, heat intolerance and polydipsia.  Genitourinary: Negative for decreased urine volume, dysuria and hematuria.  Musculoskeletal: Negative for arthralgias, back pain and gait problem.  Skin: Negative for rash.  Allergic/Immunologic: Negative for environmental allergies.  Neurological: Negative for seizures, syncope, light-headedness and headaches.  Hematological: Negative for adenopathy.  Psychiatric/Behavioral: Negative for agitation, dysphoric mood and suicidal ideas. The patient is not nervous/anxious.     Per HPI unless specifically indicated above     Objective:    BP 106/72   Pulse 81   Temp (!) 97.5 F (36.4 C)   Resp 18   SpO2 95%   Wt Readings from Last 3 Encounters:  09/28/18 140 lb (63.5 kg)  08/24/16 134 lb (60.8 kg)  04/21/15 147 lb 6 oz (66.8 kg)    Physical Exam  Constitutional: He is oriented to person, place, and time. He appears well-developed and well-nourished.  HENT:  Head: Normocephalic and atraumatic.  Mouth/Throat: Oropharynx is clear and moist. No oropharyngeal exudate.  Eyes: Pupils are equal, round, and reactive to light. Conjunctivae and EOM are normal.  Neck: Neck supple. No thyromegaly present.  Cardiovascular: Normal rate and regular rhythm.  Pulmonary/Chest: Effort normal. No respiratory distress. He has wheezes. He has no rales.  Abdominal: Soft. Bowel sounds are normal. He exhibits no mass. There  is no hepatosplenomegaly. There is no tenderness.  Musculoskeletal: He exhibits no edema.  Lymphadenopathy:    He has no cervical adenopathy.  Neurological: He is alert and oriented to person, place, and time.  Skin: Skin is warm and dry. No rash noted.  Psychiatric: He has a normal mood and affect. His behavior is normal. Thought content normal.  Vitals reviewed.   Results for orders placed or performed  during the hospital encounter of 09/28/18  Basic metabolic panel  Result Value Ref Range   Sodium 139 135 - 145 mmol/L   Potassium 3.5 3.5 - 5.1 mmol/L   Chloride 104 98 - 111 mmol/L   CO2 27 22 - 32 mmol/L   Glucose, Bld 89 70 - 99 mg/dL   BUN 12 6 - 20 mg/dL   Creatinine, Ser 1.61 0.61 - 1.24 mg/dL   Calcium 9.1 8.9 - 09.6 mg/dL   GFR calc non Af Amer >60 >60 mL/min   GFR calc Af Amer >60 >60 mL/min   Anion gap 8 5 - 15  CBC with Differential  Result Value Ref Range   WBC 11.0 (H) 4.0 - 10.5 K/uL   RBC 4.24 4.22 - 5.81 MIL/uL   Hemoglobin 13.8 13.0 - 17.0 g/dL   HCT 04.5 40.9 - 81.1 %   MCV 97.4 80.0 - 100.0 fL   MCH 32.5 26.0 - 34.0 pg   MCHC 33.4 30.0 - 36.0 g/dL   RDW 91.4 78.2 - 95.6 %   Platelets 312 150 - 400 K/uL   nRBC 0.0 0.0 - 0.2 %   Neutrophils Relative % 59 %   Neutro Abs 6.5 1.7 - 7.7 K/uL   Lymphocytes Relative 31 %   Lymphs Abs 3.4 0.7 - 4.0 K/uL   Monocytes Relative 8 %   Monocytes Absolute 0.9 0.1 - 1.0 K/uL   Eosinophils Relative 1 %   Eosinophils Absolute 0.1 0.0 - 0.5 K/uL   Basophils Relative 1 %   Basophils Absolute 0.1 0.0 - 0.1 K/uL   Immature Granulocytes 0 %   Abs Immature Granulocytes 0.02 0.00 - 0.07 K/uL  Troponin I  Result Value Ref Range   Troponin I <0.03 <0.03 ng/mL      Assessment & Plan:   Encounter Diagnoses  Name Primary?  . Encounter to establish care Yes  . Wheezing   . Acute bronchitis with COPD (HCC)   . Hyperlipidemia, unspecified hyperlipidemia type   . Tobacco use disorder     -will check fasting lipids and hepatic function -counseled smoking cessation -rx zpack and prednisone.  Pt has inhaler to use as needed -pt improved after nebulizer treatment given in office -pt was signed up for [facility] in order to get inhalers -pt to follow up 1 month.   RTO sooner prn worsening or new symptoms

## 2018-10-12 ENCOUNTER — Other Ambulatory Visit: Payer: Self-pay | Admitting: Physician Assistant

## 2018-10-12 MED ORDER — ALBUTEROL SULFATE HFA 108 (90 BASE) MCG/ACT IN AERS: 2.0000 | INHALATION_SPRAY | Freq: Four times a day (QID) | RESPIRATORY_TRACT | 0 refills | Status: DC | PRN

## 2018-11-09 ENCOUNTER — Ambulatory Visit: Payer: Self-pay | Admitting: Physician Assistant

## 2018-11-22 ENCOUNTER — Encounter: Payer: Self-pay | Admitting: Physician Assistant

## 2018-11-22 ENCOUNTER — Ambulatory Visit: Payer: Self-pay | Admitting: Physician Assistant

## 2018-11-22 VITALS — BP 122/78 | HR 75 | Temp 97.7°F | Wt 144.5 lb

## 2018-11-22 DIAGNOSIS — Z125 Encounter for screening for malignant neoplasm of prostate: Secondary | ICD-10-CM

## 2018-11-22 DIAGNOSIS — E785 Hyperlipidemia, unspecified: Secondary | ICD-10-CM

## 2018-11-22 DIAGNOSIS — J449 Chronic obstructive pulmonary disease, unspecified: Secondary | ICD-10-CM

## 2018-11-22 DIAGNOSIS — F17200 Nicotine dependence, unspecified, uncomplicated: Secondary | ICD-10-CM

## 2018-11-22 MED ORDER — MOMETASONE FURO-FORMOTEROL FUM 100-5 MCG/ACT IN AERO: 2.0000 | INHALATION_SPRAY | Freq: Two times a day (BID) | RESPIRATORY_TRACT | 1 refills | Status: DC

## 2018-11-22 NOTE — Progress Notes (Signed)
BP 122/78 (BP Location: Left Arm, Patient Position: Sitting, Cuff Size: Normal)   Pulse 75   Temp 97.7 F (36.5 C)   Wt 144 lb 8 oz (65.5 kg)   SpO2 97%   BMI 20.73 kg/m    Subjective:    Patient ID: Adam Mercado, male    DOB: [birth year], 52 y.o.   MRN: [id number]  HPI: Adam Mercado is a 52 y.o. male presenting on 11/22/2018 for Follow-up   HPI  Pt is here for follow up today.  It is his second appointment here.  He says he is Using his inhaler once or twice daily.  He continues to smoke.   He did not get his labs drawn that were ordered at last OV.    Relevant past medical, surgical, family and social history reviewed and updated as indicated. Interim medical history since our last visit reviewed. Allergies and medications reviewed and updated.   Current Outpatient Medications:  .  albuterol (PROVENTIL HFA;VENTOLIN HFA) 108 (90 Base) MCG/ACT inhaler, Inhale 2 puffs into the lungs every 6 (six) hours as needed for wheezing or shortness of breath., Disp: 3 Inhaler, Rfl: 0 .  aspirin EC 81 MG EC tablet, Take 1 tablet (81 mg total) by mouth daily., Disp: 30 tablet, Rfl: 0   Review of Systems  Constitutional: Negative for appetite change, chills, diaphoresis, fatigue, fever and unexpected weight change.  HENT: Negative for congestion, dental problem, drooling, ear pain, facial swelling, hearing loss, mouth sores, sneezing, sore throat, trouble swallowing and voice change.   Eyes: Negative for pain, discharge, redness, itching and visual disturbance.  Respiratory: Positive for shortness of breath and wheezing. Negative for cough and choking.   Cardiovascular: Positive for chest pain. Negative for palpitations and leg swelling.  Gastrointestinal: Negative for abdominal pain, blood in stool, constipation, diarrhea and vomiting.  Endocrine: Negative for cold intolerance, heat intolerance and polydipsia.  Genitourinary: Negative for decreased urine volume, dysuria and hematuria.   Musculoskeletal: Negative for arthralgias, back pain and gait problem.  Skin: Negative for rash.  Allergic/Immunologic: Negative for environmental allergies.  Neurological: Negative for seizures, syncope, light-headedness and headaches.  Hematological: Negative for adenopathy.  Psychiatric/Behavioral: Negative for agitation, dysphoric mood and suicidal ideas. The patient is not nervous/anxious.     Per HPI unless specifically indicated above     Objective:    BP 122/78 (BP Location: Left Arm, Patient Position: Sitting, Cuff Size: Normal)   Pulse 75   Temp 97.7 F (36.5 C)   Wt 144 lb 8 oz (65.5 kg)   SpO2 97%   BMI 20.73 kg/m   Wt Readings from Last 3 Encounters:  11/22/18 144 lb 8 oz (65.5 kg)  09/28/18 140 lb (63.5 kg)  08/24/16 134 lb (60.8 kg)    Physical Exam Vitals signs reviewed.  Constitutional:      Appearance: He is well-developed.  HENT:     Head: Normocephalic and atraumatic.  Neck:     Musculoskeletal: Neck supple.  Cardiovascular:     Rate and Rhythm: Normal rate and regular rhythm.  Pulmonary:     Effort: Pulmonary effort is normal.     Breath sounds: Normal breath sounds. No wheezing.  Abdominal:     General: Bowel sounds are normal.     Palpations: Abdomen is soft.     Tenderness: There is no abdominal tenderness.  Lymphadenopathy:     Cervical: No cervical adenopathy.  Skin:    General: Skin is warm and dry.  Neurological:  Mental Status: He is alert and oriented to person, place, and time.  Psychiatric:        Behavior: Behavior normal.         Assessment & Plan:   Encounter Diagnoses  Name Primary?  . Chronic obstructive pulmonary disease, unspecified COPD type (HCC) Yes  . Tobacco use disorder   . Hyperlipidemia, unspecified hyperlipidemia type   . Screening for prostate cancer      -pt counseled to Get labs drawn- add psa -will Add dulera to help breathing -counseled smoking cessation -pt to follow up 6wk to recheck  breathing.  RTO sooner prn

## 2018-12-28 ENCOUNTER — Ambulatory Visit: Payer: Self-pay | Admitting: Physician Assistant

## 2018-12-28 ENCOUNTER — Other Ambulatory Visit (HOSPITAL_COMMUNITY)
Admission: RE | Admit: 2018-12-28 | Discharge: 2018-12-28 | Disposition: A | Discharge status: Home / Self Care | Payer: Self-pay | Source: Ambulatory Visit | Attending: Physician Assistant | Admitting: Physician Assistant

## 2018-12-28 ENCOUNTER — Encounter: Payer: Self-pay | Admitting: Physician Assistant

## 2018-12-28 VITALS — BP 120/70 | HR 72 | Resp 24 | Ht 69.0 in | Wt 141.0 lb

## 2018-12-28 DIAGNOSIS — Z9119 Patient's noncompliance with other medical treatment and regimen: Secondary | ICD-10-CM

## 2018-12-28 DIAGNOSIS — F17200 Nicotine dependence, unspecified, uncomplicated: Secondary | ICD-10-CM

## 2018-12-28 DIAGNOSIS — Z91199 Patient's noncompliance with other medical treatment and regimen due to unspecified reason: Secondary | ICD-10-CM

## 2018-12-28 DIAGNOSIS — E785 Hyperlipidemia, unspecified: Secondary | ICD-10-CM | POA: Insufficient documentation

## 2018-12-28 DIAGNOSIS — Z125 Encounter for screening for malignant neoplasm of prostate: Secondary | ICD-10-CM | POA: Insufficient documentation

## 2018-12-28 DIAGNOSIS — J449 Chronic obstructive pulmonary disease, unspecified: Secondary | ICD-10-CM

## 2018-12-28 LAB — COMPREHENSIVE METABOLIC PANEL
ALT: 31 U/L (ref 0–44)
AST: 36 U/L (ref 15–41)
Albumin: 3.9 g/dL (ref 3.5–5.0)
Alkaline Phosphatase: 73 U/L (ref 38–126)
Anion gap: 7 (ref 5–15)
BUN: 9 mg/dL (ref 6–20)
CO2: 26 mmol/L (ref 22–32)
Calcium: 9.1 mg/dL (ref 8.9–10.3)
Chloride: 106 mmol/L (ref 98–111)
Creatinine, Ser: 0.89 mg/dL (ref 0.61–1.24)
GFR calc Af Amer: >60 mL/min (ref >60)
GFR calc non Af Amer: >60 mL/min (ref >60)
Glucose, Bld: 94 mg/dL (ref 70–99)
Potassium: 3.7 mmol/L (ref 3.5–5.1)
Sodium: 139 mmol/L (ref 135–145)
Total Bilirubin: 1.1 mg/dL (ref 0.3–1.2)
Total Protein: 7.2 g/dL (ref 6.5–8.1)

## 2018-12-28 LAB — PSA: Prostatic Specific Antigen: 0.45 ng/mL (ref 0.00–4.00)

## 2018-12-28 LAB — LIPID PANEL
Cholesterol: 258 mg/dL — ABNORMAL HIGH (ref 0–200)
HDL: 75 mg/dL (ref >40)
LDL Cholesterol: 161 mg/dL — ABNORMAL HIGH (ref 0–99)
Total CHOL/HDL Ratio: 3.4 RATIO
Triglycerides: 111 mg/dL (ref <150)
VLDL: 22 mg/dL (ref 0–40)

## 2018-12-28 MED ORDER — ALBUTEROL SULFATE HFA 108 (90 BASE) MCG/ACT IN AERS: 2.0000 | INHALATION_SPRAY | Freq: Four times a day (QID) | RESPIRATORY_TRACT | 3 refills | Status: DC | PRN

## 2018-12-28 NOTE — Progress Notes (Signed)
BP 120/70 (BP Location: Right Arm, Patient Position: Sitting, Cuff Size: Normal)   Pulse 72   Resp (!) 24   Ht 5\' 9"  (1.753 m)   Wt 141 lb (64 kg)   SpO2 98%   BMI 20.82 kg/m    Subjective:    Patient ID: Adam Mercado, male    DOB: 1966/08/01, 53 y.o.   MRN: [id number]  HPI: Adam Mercado is a 53 y.o. male presenting on 12/28/2018 for Breathing Problem (ran out of inhaler 1 1/2 weeks ago, has not received it from [facility])   HPI  Chief Complaint  Patient presents with  . Breathing Problem    ran out of inhaler 1 1/2 weeks ago, has not received it from [facility]   Pt is still smoking  Pt has still not gotten labs drawn (ordered at new pt appointment on November 5).   Relevant past medical, surgical, family and social history reviewed and updated as indicated. Interim medical history since our last visit reviewed. Allergies and medications reviewed and updated.   Current Outpatient Medications:  .  aspirin EC 81 MG EC tablet, Take 1 tablet (81 mg total) by mouth daily., Disp: 30 tablet, Rfl: 0 .  albuterol (PROVENTIL HFA;VENTOLIN HFA) 108 (90 Base) MCG/ACT inhaler, Inhale 2 puffs into the lungs every 6 (six) hours as needed for wheezing or shortness of breath. (Patient not taking: Reported on 12/28/2018), Disp: 3 Inhaler, Rfl: 0 .  mometasone-formoterol (DULERA) 100-5 MCG/ACT AERO, Inhale 2 puffs into the lungs 2 (two) times daily. (Patient not taking: Reported on 12/28/2018), Disp: 3 Inhaler, Rfl: 1    Review of Systems  Constitutional: Positive for diaphoresis. Negative for appetite change, chills, fatigue, fever and unexpected weight change.  HENT: Positive for congestion. Negative for dental problem, drooling, ear pain, facial swelling, hearing loss, mouth sores, sneezing, sore throat, trouble swallowing and voice change.   Eyes: Negative for pain, discharge, redness, itching and visual disturbance.  Respiratory: Positive for shortness of breath. Negative for cough,  choking and wheezing.   Cardiovascular: Negative for chest pain, palpitations and leg swelling.  Gastrointestinal: Negative for abdominal pain, blood in stool, constipation, diarrhea and vomiting.  Endocrine: Negative for cold intolerance, heat intolerance and polydipsia.  Genitourinary: Negative for decreased urine volume, dysuria and hematuria.  Musculoskeletal: Negative for arthralgias, back pain and gait problem.  Skin: Negative for rash.  Allergic/Immunologic: Negative for environmental allergies.  Neurological: Negative for seizures, syncope, light-headedness and headaches.  Hematological: Negative for adenopathy.  Psychiatric/Behavioral: Negative for agitation, dysphoric mood and suicidal ideas. The patient is not nervous/anxious.     Per HPI unless specifically indicated above     Objective:    BP 120/70 (BP Location: Right Arm, Patient Position: Sitting, Cuff Size: Normal)   Pulse 72   Resp (!) 24   Ht 5\' 9"  (1.753 m)   Wt 141 lb (64 kg)   SpO2 98%   BMI 20.82 kg/m   Wt Readings from Last 3 Encounters:  12/28/18 141 lb (64 kg)  11/22/18 144 lb 8 oz (65.5 kg)  09/28/18 140 lb (63.5 kg)    Physical Exam Vitals signs reviewed.  Constitutional:      Appearance: He is well-developed.  HENT:     Head: Normocephalic and atraumatic.  Neck:     Musculoskeletal: Neck supple.  Cardiovascular:     Rate and Rhythm: Normal rate and regular rhythm.  Pulmonary:     Effort: Pulmonary effort is normal.  Breath sounds: Normal breath sounds. No wheezing.  Abdominal:     General: Bowel sounds are normal.     Palpations: Abdomen is soft.     Tenderness: There is no abdominal tenderness.  Lymphadenopathy:     Cervical: No cervical adenopathy.  Skin:    General: Skin is warm and dry.  Neurological:     Mental Status: He is alert and oriented to person, place, and time.  Psychiatric:        Behavior: Behavior normal.         Assessment & Plan:   Encounter Diagnoses   Name Primary?  . Chronic obstructive pulmonary disease, unspecified COPD type (HCC) Yes  . Tobacco use disorder   . Hyperlipidemia, unspecified hyperlipidemia type   . Personal history of noncompliance with medical treatment, presenting hazards to health     -pt to get labs drawn -rx albuterol MDI given -counseled smoking cessation -nurse called [facility] and says that pt is approved and should be getting his rx in the mail soon -pt to follow up 3 month.

## 2018-12-29 ENCOUNTER — Other Ambulatory Visit: Payer: Self-pay | Admitting: Physician Assistant

## 2018-12-29 DIAGNOSIS — E785 Hyperlipidemia, unspecified: Secondary | ICD-10-CM

## 2018-12-29 MED ORDER — ATORVASTATIN CALCIUM 20 MG PO TABS: 20.0000 mg | ORAL_TABLET | Freq: Every day | ORAL | 1 refills | Status: DC

## 2019-01-03 ENCOUNTER — Ambulatory Visit: Payer: Self-pay | Admitting: Physician Assistant

## 2019-03-24 ENCOUNTER — Other Ambulatory Visit: Payer: Self-pay | Admitting: Physician Assistant

## 2019-03-29 ENCOUNTER — Ambulatory Visit: Payer: Self-pay | Admitting: Physician Assistant

## 2019-03-29 ENCOUNTER — Encounter: Payer: Self-pay | Admitting: Physician Assistant

## 2019-03-29 DIAGNOSIS — F17200 Nicotine dependence, unspecified, uncomplicated: Secondary | ICD-10-CM

## 2019-03-29 DIAGNOSIS — J449 Chronic obstructive pulmonary disease, unspecified: Secondary | ICD-10-CM

## 2019-03-29 DIAGNOSIS — E785 Hyperlipidemia, unspecified: Secondary | ICD-10-CM

## 2019-03-29 MED ORDER — MOMETASONE FURO-FORMOTEROL FUM 100-5 MCG/ACT IN AERO: 2.0000 | INHALATION_SPRAY | Freq: Two times a day (BID) | RESPIRATORY_TRACT | 1 refills | Status: DC

## 2019-03-29 MED ORDER — ATORVASTATIN CALCIUM 20 MG PO TABS: 20.0000 mg | ORAL_TABLET | Freq: Every day | ORAL | 1 refills | Status: DC

## 2019-03-29 MED ORDER — ALBUTEROL SULFATE HFA 108 (90 BASE) MCG/ACT IN AERS: INHALATION_SPRAY | RESPIRATORY_TRACT | 1 refills | Status: DC

## 2019-03-29 NOTE — Progress Notes (Signed)
   There were no vitals taken for this visit.   Subjective:    Patient ID: Adam Mercado, male    DOB: [birth year], 53 y.o.   MRN: [id number]  HPI: Adam Mercado is a 53 y.o. male presenting on 03/29/2019 for No chief complaint on file.   HPI   This is a telemedicine visit due to coronavirus pandemic.  It is via telephone as pt does not have a smartphone.  I connected with  Adam Mercado on 03/29/19 by a video enabled telemedicine application and verified that I am speaking with the correct person using two identifiers.   I discussed the limitations of evaluation and management by telemedicine. The patient expressed understanding and agreed to proceed.  Pt still smoking but not as much.  He still has some sob but it is improved from how he was in the past (before inhalers).  He says he is not having any other problems at this time.   Relevant past medical, surgical, family and social history reviewed and updated as indicated. Interim medical history since our last visit reviewed. Allergies and medications reviewed and updated.   Current Outpatient Medications:  .  aspirin EC 81 MG EC tablet, Take 1 tablet (81 mg total) by mouth daily., Disp: 30 tablet, Rfl: 0 .  atorvastatin (LIPITOR) 20 MG tablet, Take 1 tablet (20 mg total) by mouth daily., Disp: 90 tablet, Rfl: 1 .  mometasone-formoterol (DULERA) 100-5 MCG/ACT AERO, Inhale 2 puffs into the lungs 2 (two) times daily., Disp: 3 Inhaler, Rfl: 1 .  PROVENTIL HFA 108 (90 Base) MCG/ACT inhaler, INHALE 2 PUFFS BY MOUTH EVERY 6 HOURS AS NEEDED FOR COUGHING, WHEEZING, OR SHORTNESS OF BREATH, Disp: 20.1 g, Rfl: 0    Review of Systems  Per HPI unless specifically indicated above     Objective:    There were no vitals taken for this visit.  Wt Readings from Last 3 Encounters:  12/28/18 141 lb (64 kg)  11/22/18 144 lb 8 oz (65.5 kg)  09/28/18 140 lb (63.5 kg)    Physical Exam Pulmonary:     Effort: Pulmonary effort is normal. No  respiratory distress.     Comments: Pt speaks in complete sentences without sob or stopping to rests Neurological:     Mental Status: He is alert and oriented to person, place, and time.  Psychiatric:        Mood and Affect: Mood normal.          Assessment & Plan:    Encounter Diagnoses  Name Primary?  . Chronic obstructive pulmonary disease, unspecified COPD type (HCC) Yes  . Tobacco use disorder   . Hyperlipidemia, unspecified hyperlipidemia type      -will defer labs at this time due to CV19 -Pt to continue current medications -counseled smoking cessation -encouraged pt to follow recommendations including staying at home and wearing face coverings -pt to follow up in 3 months.  He is to contact office sooner if needed

## 2019-06-13 ENCOUNTER — Ambulatory Visit: Payer: Self-pay | Admitting: Physician Assistant

## 2019-06-21 ENCOUNTER — Telehealth: Payer: Self-pay | Admitting: *Deleted

## 2019-06-21 ENCOUNTER — Other Ambulatory Visit (HOSPITAL_COMMUNITY)
Admission: RE | Admit: 2019-06-21 | Discharge: 2019-06-21 | Disposition: A | Discharge status: Home / Self Care | Payer: Self-pay | Source: Ambulatory Visit | Attending: Physician Assistant | Admitting: Physician Assistant

## 2019-06-21 ENCOUNTER — Encounter: Payer: Self-pay | Admitting: Physician Assistant

## 2019-06-21 ENCOUNTER — Ambulatory Visit: Payer: Self-pay | Admitting: Physician Assistant

## 2019-06-21 DIAGNOSIS — E785 Hyperlipidemia, unspecified: Secondary | ICD-10-CM | POA: Insufficient documentation

## 2019-06-21 DIAGNOSIS — F17200 Nicotine dependence, unspecified, uncomplicated: Secondary | ICD-10-CM

## 2019-06-21 DIAGNOSIS — Z20822 Contact with and (suspected) exposure to covid-19: Secondary | ICD-10-CM

## 2019-06-21 DIAGNOSIS — R109 Unspecified abdominal pain: Secondary | ICD-10-CM

## 2019-06-21 DIAGNOSIS — R0602 Shortness of breath: Secondary | ICD-10-CM

## 2019-06-21 DIAGNOSIS — J449 Chronic obstructive pulmonary disease, unspecified: Secondary | ICD-10-CM

## 2019-06-21 DIAGNOSIS — R61 Generalized hyperhidrosis: Secondary | ICD-10-CM

## 2019-06-21 LAB — COMPREHENSIVE METABOLIC PANEL
ALT: 36 U/L (ref 0–44)
AST: 35 U/L (ref 15–41)
Albumin: 3.7 g/dL (ref 3.5–5.0)
Alkaline Phosphatase: 85 U/L (ref 38–126)
Anion gap: 10 (ref 5–15)
BUN: 15 mg/dL (ref 6–20)
CO2: 26 mmol/L (ref 22–32)
Calcium: 9.1 mg/dL (ref 8.9–10.3)
Chloride: 103 mmol/L (ref 98–111)
Creatinine, Ser: 0.94 mg/dL (ref 0.61–1.24)
GFR calc Af Amer: >60 mL/min (ref >60)
GFR calc non Af Amer: >60 mL/min (ref >60)
Glucose, Bld: 106 mg/dL — ABNORMAL HIGH (ref 70–99)
Potassium: 3.9 mmol/L (ref 3.5–5.1)
Sodium: 139 mmol/L (ref 135–145)
Total Bilirubin: 1.8 mg/dL — ABNORMAL HIGH (ref 0.3–1.2)
Total Protein: 6.8 g/dL (ref 6.5–8.1)

## 2019-06-21 LAB — LIPID PANEL
Cholesterol: 246 mg/dL — ABNORMAL HIGH (ref 0–200)
HDL: 88 mg/dL (ref >40)
LDL Cholesterol: 128 mg/dL — ABNORMAL HIGH (ref 0–99)
Total CHOL/HDL Ratio: 2.8 RATIO
Triglycerides: 151 mg/dL — ABNORMAL HIGH (ref <150)
VLDL: 30 mg/dL (ref 0–40)

## 2019-06-21 NOTE — Telephone Encounter (Signed)
Spoke with patient.  Advised him no appointment is needed at testing site.  He states he plans to go either today or tomorrow to [facility] for COVID 19 test between 8 am -3:45 pm.  Testing protocol reviewed.  Order placed.

## 2019-06-21 NOTE — Telephone Encounter (Signed)
-----   Message from Soyla Dryer, [place] sent at 06/21/2019  9:44 AM EDT ----- Kermit Balo morning,   Mr Derryl Uher needs to be tested for covid 19 due to Night sweats, SOB,  stomach cramps   The contact information in his chart is UTD.  His phone number is [phone]  Thank you.

## 2019-06-21 NOTE — Progress Notes (Signed)
There were no vitals taken for this visit.   Subjective:    Patient ID: Adam Mercado, male    DOB: 1966-01-18, 53 y.o.   MRN: [id number]  HPI: Adam Mercado is a 53 y.o. male presenting on 06/21/2019 for COPD and Hyperlipidemia   HPI    This is a telemedicine appointment due to coronavirus pandemic.  It is via telephone as pt does not have a phone or computer with video capabilities  I connected with  Adam Mercado on 06/21/19 by a video enabled telemedicine application and verified that I am speaking with the correct person using two identifiers.   I discussed the limitations of evaluation and management by telemedicine. The patient expressed understanding and agreed to proceed.  Pt is at home.  Provider is at office   For the past week pt has been having cramps in his stomach and night sweats.  Some sob.    He has not checked his temperature  No diarrhea or emesis  Pt has not worked in the past month.  Pt is still smoking.    Relevant past medical, surgical, family and social history reviewed and updated as indicated. Interim medical history since our last visit reviewed. Allergies and medications reviewed and updated.   Current Outpatient Medications:  .  albuterol (PROVENTIL HFA) 108 (90 Base) MCG/ACT inhaler, INHALE 2 PUFFS BY MOUTH EVERY 6 HOURS AS NEEDED FOR COUGHING, WHEEZING, OR SHORTNESS OF BREATH, Disp: 3 Inhaler, Rfl: 1 .  aspirin EC 81 MG EC tablet, Take 1 tablet (81 mg total) by mouth daily., Disp: 30 tablet, Rfl: 0 .  atorvastatin (LIPITOR) 20 MG tablet, Take 1 tablet (20 mg total) by mouth daily., Disp: 90 tablet, Rfl: 1 .  mometasone-formoterol (DULERA) 100-5 MCG/ACT AERO, Inhale 2 puffs into the lungs 2 (two) times daily., Disp: 3 Inhaler, Rfl: 1   Review of Systems  Per HPI unless specifically indicated above     Objective:    There were no vitals taken for this visit.  Wt Readings from Last 3 Encounters:  12/28/18 141 lb (64 kg)  11/22/18 144 lb 8  oz (65.5 kg)  09/28/18 140 lb (63.5 kg)    Physical Exam Pulmonary:     Effort: No respiratory distress.  Neurological:     Mental Status: He is alert and oriented to person, place, and time.  Psychiatric:        Attention and Perception: Attention normal.        Speech: Speech normal.        Behavior: Behavior is cooperative.     Results for orders placed or performed during the hospital encounter of 06/21/19  Lipid panel  Result Value Ref Range   Cholesterol 246 (H) 0 - 200 mg/dL   Triglycerides 151 (H) <150 mg/dL   HDL 88 >40 mg/dL   Total CHOL/HDL Ratio 2.8 RATIO   VLDL 30 0 - 40 mg/dL   LDL Cholesterol 128 (H) 0 - 99 mg/dL  Comprehensive metabolic panel  Result Value Ref Range   Sodium 139 135 - 145 mmol/L   Potassium 3.9 3.5 - 5.1 mmol/L   Chloride 103 98 - 111 mmol/L   CO2 26 22 - 32 mmol/L   Glucose, Bld 106 (H) 70 - 99 mg/dL   BUN 15 6 - 20 mg/dL   Creatinine, Ser 0.94 0.61 - 1.24 mg/dL   Calcium 9.1 8.9 - 10.3 mg/dL   Total Protein 6.8 6.5 - 8.1 g/dL   Albumin  3.7 3.5 - 5.0 g/dL   AST 35 15 - 41 U/L   ALT 36 0 - 44 U/L   Alkaline Phosphatase 85 38 - 126 U/L   Total Bilirubin 1.8 (H) 0.3 - 1.2 mg/dL   GFR calc non Af Amer >60 >60 mL/min   GFR calc Af Amer >60 >60 mL/min   Anion gap 10 5 - 15      Assessment & Plan:    Encounter Diagnoses  Name Primary?  . Night sweats Yes  . Stomach cramps   . Chronic obstructive pulmonary disease, unspecified COPD type (HCC)   . Hyperlipidemia, unspecified hyperlipidemia type   . Tobacco use disorder   . SOB (shortness of breath)   . Suspected 2019 Novel Coronavirus Infection      -reviewed labs with pt. -he is to continue current meds and watch lowfat diet -will put pt on Dental list -will Refer for CoVid19 testing -encouraged smoking cessation -pt to follow up 3 months.  He is to contact office sooner prn

## 2019-06-22 ENCOUNTER — Other Ambulatory Visit: Payer: Self-pay

## 2019-06-22 DIAGNOSIS — Z20822 Contact with and (suspected) exposure to covid-19: Secondary | ICD-10-CM

## 2019-06-26 LAB — NOVEL CORONAVIRUS, NAA: SARS-CoV-2, NAA: NOT DETECTED

## 2019-09-21 ENCOUNTER — Other Ambulatory Visit (HOSPITAL_COMMUNITY)
Admission: RE | Admit: 2019-09-21 | Discharge: 2019-09-21 | Disposition: A | Discharge status: Home / Self Care | Payer: Self-pay | Source: Ambulatory Visit | Attending: Physician Assistant | Admitting: Physician Assistant

## 2019-09-21 DIAGNOSIS — E785 Hyperlipidemia, unspecified: Secondary | ICD-10-CM | POA: Insufficient documentation

## 2019-09-21 LAB — LIPID PANEL
Cholesterol: 184 mg/dL (ref 0–200)
HDL: 57 mg/dL (ref >40)
LDL Cholesterol: 73 mg/dL (ref 0–99)
Total CHOL/HDL Ratio: 3.2 RATIO
Triglycerides: 272 mg/dL — ABNORMAL HIGH (ref <150)
VLDL: 54 mg/dL — ABNORMAL HIGH (ref 0–40)

## 2019-09-21 LAB — COMPREHENSIVE METABOLIC PANEL
ALT: 36 U/L (ref 0–44)
AST: 38 U/L (ref 15–41)
Albumin: 3.9 g/dL (ref 3.5–5.0)
Alkaline Phosphatase: 84 U/L (ref 38–126)
Anion gap: 9 (ref 5–15)
BUN: 14 mg/dL (ref 6–20)
CO2: 23 mmol/L (ref 22–32)
Calcium: 8.7 mg/dL — ABNORMAL LOW (ref 8.9–10.3)
Chloride: 109 mmol/L (ref 98–111)
Creatinine, Ser: 0.96 mg/dL (ref 0.61–1.24)
GFR calc Af Amer: >60 mL/min (ref >60)
GFR calc non Af Amer: >60 mL/min (ref >60)
Glucose, Bld: 115 mg/dL — ABNORMAL HIGH (ref 70–99)
Potassium: 3.8 mmol/L (ref 3.5–5.1)
Sodium: 141 mmol/L (ref 135–145)
Total Bilirubin: 1 mg/dL (ref 0.3–1.2)
Total Protein: 7.5 g/dL (ref 6.5–8.1)

## 2019-09-25 ENCOUNTER — Ambulatory Visit: Payer: Self-pay | Admitting: Physician Assistant

## 2019-09-25 ENCOUNTER — Encounter: Payer: Self-pay | Admitting: Physician Assistant

## 2019-09-25 DIAGNOSIS — E785 Hyperlipidemia, unspecified: Secondary | ICD-10-CM

## 2019-09-25 DIAGNOSIS — Z125 Encounter for screening for malignant neoplasm of prostate: Secondary | ICD-10-CM

## 2019-09-25 DIAGNOSIS — J449 Chronic obstructive pulmonary disease, unspecified: Secondary | ICD-10-CM

## 2019-09-25 DIAGNOSIS — F17200 Nicotine dependence, unspecified, uncomplicated: Secondary | ICD-10-CM

## 2019-09-25 MED ORDER — DULERA 200-5 MCG/ACT IN AERO: 2.0000 | INHALATION_SPRAY | Freq: Two times a day (BID) | RESPIRATORY_TRACT | 1 refills | Status: DC

## 2019-09-25 MED ORDER — ALBUTEROL SULFATE HFA 108 (90 BASE) MCG/ACT IN AERS: INHALATION_SPRAY | RESPIRATORY_TRACT | 1 refills | Status: DC

## 2019-09-25 MED ORDER — ATORVASTATIN CALCIUM 20 MG PO TABS: 20.0000 mg | ORAL_TABLET | Freq: Every day | ORAL | 1 refills | Status: DC

## 2019-09-25 NOTE — Progress Notes (Signed)
There were no vitals taken for this visit.   Subjective:    Patient ID: Adam Mercado, male    DOB: April 03, 1966, 53 y.o.   MRN: [id number]  HPI: Adam Mercado is a 53 y.o. male presenting on 09/25/2019 for No chief complaint on file.   HPI   This is a telemedicine appointment due to coronavirus.  It is via telephone as pt does not have video enabled device.  I connected with  Mika Anastasi on 09/25/19 by a video enabled telemedicine application and verified that I am speaking with the correct person using two identifiers.   I discussed the limitations of evaluation and management by telemedicine. The patient expressed understanding and agreed to proceed.  Pt is at home.  Provider is at office.    Pt says he is coughing up phlegm for a month.  Some sob.  No fever.    No CP.  Some wheezing.    He is still smoking.    Pt had negative covid test in July.  He wears a mask when he goes to the store.    When asked if his symptoms comes and goes or if it's constant, he says it's so -so.       Relevant past medical, surgical, family and social history reviewed and updated as indicated. Interim medical history since our last visit reviewed. Allergies and medications reviewed and updated.   Current Outpatient Medications:  .  albuterol (PROVENTIL HFA) 108 (90 Base) MCG/ACT inhaler, INHALE 2 PUFFS BY MOUTH EVERY 6 HOURS AS NEEDED FOR COUGHING, WHEEZING, OR SHORTNESS OF BREATH, Disp: 3 Inhaler, Rfl: 1 .  aspirin EC 81 MG EC tablet, Take 1 tablet (81 mg total) by mouth daily., Disp: 30 tablet, Rfl: 0 .  atorvastatin (LIPITOR) 20 MG tablet, Take 1 tablet (20 mg total) by mouth daily., Disp: 90 tablet, Rfl: 1 .  mometasone-formoterol (DULERA) 100-5 MCG/ACT AERO, Inhale 2 puffs into the lungs 2 (two) times daily., Disp: 3 Inhaler, Rfl: 1     Review of Systems  Per HPI unless specifically indicated above     Objective:    There were no vitals taken for this visit.  Wt Readings from Last  3 Encounters:  12/28/18 141 lb (64 kg)  11/22/18 144 lb 8 oz (65.5 kg)  09/28/18 140 lb (63.5 kg)    Physical Exam Pulmonary:     Effort: No respiratory distress.  Neurological:     Mental Status: He is alert and oriented to person, place, and time.  Psychiatric:        Attention and Perception: Attention normal.        Speech: Speech normal.        Behavior: Behavior is cooperative.     Results for orders placed or performed during the hospital encounter of 09/21/19  Lipid panel  Result Value Ref Range   Cholesterol 184 0 - 200 mg/dL   Triglycerides 606 (H) <150 mg/dL   HDL 57 >30 mg/dL   Total CHOL/HDL Ratio 3.2 RATIO   VLDL 54 (H) 0 - 40 mg/dL   LDL Cholesterol 73 0 - 99 mg/dL  Comprehensive metabolic panel  Result Value Ref Range   Sodium 141 135 - 145 mmol/L   Potassium 3.8 3.5 - 5.1 mmol/L   Chloride 109 98 - 111 mmol/L   CO2 23 22 - 32 mmol/L   Glucose, Bld 115 (H) 70 - 99 mg/dL   BUN 14 6 - 20 mg/dL  Creatinine, Ser 0.96 0.61 - 1.24 mg/dL   Calcium 8.7 (L) 8.9 - 10.3 mg/dL   Total Protein 7.5 6.5 - 8.1 g/dL   Albumin 3.9 3.5 - 5.0 g/dL   AST 38 15 - 41 U/L   ALT 36 0 - 44 U/L   Alkaline Phosphatase 84 38 - 126 U/L   Total Bilirubin 1.0 0.3 - 1.2 mg/dL   GFR calc non Af Amer >60 >60 mL/min   GFR calc Af Amer >60 >60 mL/min   Anion gap 9 5 - 15      Assessment & Plan:    Encounter Diagnoses  Name Primary?  . Chronic obstructive pulmonary disease, unspecified COPD type ([place]) Yes  . Hyperlipidemia, unspecified hyperlipidemia type   . Tobacco use disorder     -Reviewed labs with pt -will Increase dulera to 261mcg -urged pt to stop smoking -Pt to continue statin -pt to follow up in  3 months.  He is to contact office if his cough/copd fails to improve or if new sympyoms develop

## 2020-01-02 ENCOUNTER — Ambulatory Visit: Payer: Self-pay | Admitting: Physician Assistant

## 2020-01-29 ENCOUNTER — Other Ambulatory Visit: Payer: Self-pay

## 2020-01-29 ENCOUNTER — Other Ambulatory Visit (HOSPITAL_COMMUNITY)
Admission: RE | Admit: 2020-01-29 | Discharge: 2020-01-29 | Disposition: A | Discharge status: Home / Self Care | Payer: Medicaid Other | Source: Ambulatory Visit | Attending: Physician Assistant | Admitting: Physician Assistant

## 2020-01-29 DIAGNOSIS — E785 Hyperlipidemia, unspecified: Secondary | ICD-10-CM | POA: Diagnosis present

## 2020-01-29 DIAGNOSIS — Z125 Encounter for screening for malignant neoplasm of prostate: Secondary | ICD-10-CM | POA: Diagnosis not present

## 2020-01-29 DIAGNOSIS — R69 Illness, unspecified: Secondary | ICD-10-CM | POA: Diagnosis present

## 2020-01-29 LAB — LIPID PANEL
Cholesterol: 176 mg/dL (ref 0–200)
HDL: 64 mg/dL (ref >40)
LDL Cholesterol: 92 mg/dL (ref 0–99)
Total CHOL/HDL Ratio: 2.8 RATIO
Triglycerides: 102 mg/dL (ref <150)
VLDL: 20 mg/dL (ref 0–40)

## 2020-01-29 LAB — COMPREHENSIVE METABOLIC PANEL
ALT: 222 U/L — ABNORMAL HIGH (ref 0–44)
AST: 551 U/L — ABNORMAL HIGH (ref 15–41)
Albumin: 4.1 g/dL (ref 3.5–5.0)
Alkaline Phosphatase: 95 U/L (ref 38–126)
Anion gap: 9 (ref 5–15)
BUN: 16 mg/dL (ref 6–20)
CO2: 24 mmol/L (ref 22–32)
Calcium: 9 mg/dL (ref 8.9–10.3)
Chloride: 105 mmol/L (ref 98–111)
Creatinine, Ser: 1.07 mg/dL (ref 0.61–1.24)
GFR calc Af Amer: >60 mL/min (ref >60)
GFR calc non Af Amer: >60 mL/min (ref >60)
Glucose, Bld: 97 mg/dL (ref 70–99)
Potassium: 3.8 mmol/L (ref 3.5–5.1)
Sodium: 138 mmol/L (ref 135–145)
Total Bilirubin: 1.6 mg/dL — ABNORMAL HIGH (ref 0.3–1.2)
Total Protein: 7.3 g/dL (ref 6.5–8.1)

## 2020-01-29 LAB — PSA: Prostatic Specific Antigen: 0.3 ng/mL (ref 0.00–4.00)

## 2020-01-30 ENCOUNTER — Other Ambulatory Visit: Payer: Self-pay

## 2020-01-30 ENCOUNTER — Encounter: Payer: Self-pay | Admitting: Physician Assistant

## 2020-01-30 ENCOUNTER — Ambulatory Visit: Payer: Self-pay | Admitting: Physician Assistant

## 2020-01-30 VITALS — BP 120/78 | HR 91 | Temp 93.9°F | Wt 143.8 lb

## 2020-01-30 DIAGNOSIS — Z789 Other specified health status: Secondary | ICD-10-CM

## 2020-01-30 DIAGNOSIS — E785 Hyperlipidemia, unspecified: Secondary | ICD-10-CM

## 2020-01-30 DIAGNOSIS — F1090 Alcohol use, unspecified, uncomplicated: Secondary | ICD-10-CM

## 2020-01-30 DIAGNOSIS — R7989 Other specified abnormal findings of blood chemistry: Secondary | ICD-10-CM

## 2020-01-30 DIAGNOSIS — Z1211 Encounter for screening for malignant neoplasm of colon: Secondary | ICD-10-CM

## 2020-01-30 DIAGNOSIS — J449 Chronic obstructive pulmonary disease, unspecified: Secondary | ICD-10-CM

## 2020-01-30 NOTE — Progress Notes (Signed)
BP 120/78   Pulse 91   Temp (!) 93.9 F (34.4 C)   Wt 143 lb 12.8 oz (65.2 kg)   SpO2 98%   BMI 21.24 kg/m    Subjective:    Patient ID: Adam Mercado, male    DOB: [birth year], 54 y.o.   MRN: [id number]  HPI: Tyri Elmore is a 54 y.o. male presenting on 01/30/2020 for COPD and Hyperlipidemia   HPI   This is a telemedicine appointment through [employer] due to coronavirus pandemic.    I connected with  Haroon Shatto on 01/30/20 by a video enabled telemedicine application and verified that I am speaking with the correct person using two identifiers.   I discussed the limitations of evaluation and management by telemedicine. The patient expressed understanding and agreed to proceed.   Pt is at [facility] office.  Provider is working from home office.    Pt is 53yoM with dyslipidemia and COPD.  He has history of substance abuse.    He says that currently he is Drinking every day- maybe a pint /day.   He does not consider himself to be an alcoholic.   Also he is taking a lot of APAP.  Pt says he does have Some sob at times.  Quit smoking last month  Pt has applied for disability due to breathing problems.      Relevant past medical, surgical, family and social history reviewed and updated as indicated. Interim medical history since our last visit reviewed. Allergies and medications reviewed and updated.   Current Outpatient Medications:  .  albuterol (PROVENTIL HFA) 108 (90 Base) MCG/ACT inhaler, INHALE 2 PUFFS BY MOUTH EVERY 6 HOURS AS NEEDED FOR COUGHING, WHEEZING, OR SHORTNESS OF BREATH, Disp: 3 g, Rfl: 1 .  aspirin EC 81 MG EC tablet, Take 1 tablet (81 mg total) by mouth daily., Disp: 30 tablet, Rfl: 0 .  atorvastatin (LIPITOR) 20 MG tablet, Take 1 tablet (20 mg total) by mouth daily., Disp: 90 tablet, Rfl: 1 .  mometasone-formoterol (DULERA) 200-5 MCG/ACT AERO, Inhale 2 puffs into the lungs 2 (two) times daily., Disp: 3 g, Rfl: 1     Review of Systems  Per HPI unless  specifically indicated above     Objective:    BP 120/78   Pulse 91   Temp (!) 93.9 F (34.4 C)   Wt 143 lb 12.8 oz (65.2 kg)   SpO2 98%   BMI 21.24 kg/m   Wt Readings from Last 3 Encounters:  01/30/20 143 lb 12.8 oz (65.2 kg)  12/28/18 141 lb (64 kg)  11/22/18 144 lb 8 oz (65.5 kg)    Physical Exam Constitutional:      General: He is not in acute distress.    Appearance: Normal appearance. He is not ill-appearing.  HENT:     Head: Normocephalic and atraumatic.  Pulmonary:     Effort: Pulmonary effort is normal. No respiratory distress.  Neurological:     Mental Status: He is alert and oriented to person, place, and time.  Psychiatric:        Attention and Perception: Attention normal.        Mood and Affect: Mood normal.        Speech: Speech normal.        Behavior: Behavior normal. Behavior is cooperative.     Results for orders placed or performed during the hospital encounter of 01/29/20  PSA  Result Value Ref Range   Prostatic Specific Antigen 0.30 0.00 -  4.00 ng/mL  Lipid panel  Result Value Ref Range   Cholesterol 176 0 - 200 mg/dL   Triglycerides 102 <150 mg/dL   HDL 64 >40 mg/dL   Total CHOL/HDL Ratio 2.8 RATIO   VLDL 20 0 - 40 mg/dL   LDL Cholesterol 92 0 - 99 mg/dL  Comprehensive metabolic panel  Result Value Ref Range   Sodium 138 135 - 145 mmol/L   Potassium 3.8 3.5 - 5.1 mmol/L   Chloride 105 98 - 111 mmol/L   CO2 24 22 - 32 mmol/L   Glucose, Bld 97 70 - 99 mg/dL   BUN 16 6 - 20 mg/dL   Creatinine, Ser 1.07 0.61 - 1.24 mg/dL   Calcium 9.0 8.9 - 10.3 mg/dL   Total Protein 7.3 6.5 - 8.1 g/dL   Albumin 4.1 3.5 - 5.0 g/dL   AST 551 (H) 15 - 41 U/L   ALT 222 (H) 0 - 44 U/L   Alkaline Phosphatase 95 38 - 126 U/L   Total Bilirubin 1.6 (H) 0.3 - 1.2 mg/dL   GFR calc non Af Amer >60 >60 mL/min   GFR calc Af Amer >60 >60 mL/min   Anion gap 9 5 - 15      Assessment & Plan:   Encounter Diagnoses  Name Primary?  . Elevated LFTs Yes  .  Chronic obstructive pulmonary disease, unspecified COPD type ([place])   . Hyperlipidemia, unspecified hyperlipidemia type   . Heavy alcohol consumption   . Screening for colon cancer      -Gave ifobt for colon cancer screening -urged pt to stop drinking alcohol due to elevated LFTs.   Also encouraged pt to avoid APAP.  Discussed that he can use some IBU or aleve if he needs analgesic -pt to continue atorvastatin for lipids.  Unlikely this is cause of enzyme elevation -pt congratulated on stopping smoking.  encouraged him to stay off the cigarettes -F/u 1 month with virtual appointment to recheck lfts

## 2020-01-31 ENCOUNTER — Other Ambulatory Visit: Payer: Self-pay | Admitting: Physician Assistant

## 2020-01-31 ENCOUNTER — Encounter: Payer: Self-pay | Admitting: Physician Assistant

## 2020-01-31 DIAGNOSIS — Z1211 Encounter for screening for malignant neoplasm of colon: Secondary | ICD-10-CM

## 2020-02-17 ENCOUNTER — Ambulatory Visit: Payer: Self-pay | Attending: Internal Medicine

## 2020-02-17 DIAGNOSIS — Z23 Encounter for immunization: Secondary | ICD-10-CM

## 2020-02-17 NOTE — Progress Notes (Signed)
   Covid-19 Vaccination [facility]  Name:  Truman Aceituno    MRN: [id number] DOB: Dec 05, 1966  02/17/2020  Mr. Mcclish was observed post Covid-19 immunization for 15 minutes without incident. He was provided with Vaccine Information Sheet and instruction to access the V-Safe system.   Mr. Vogelsang was instructed to call 911 with any severe reactions post vaccine: Marland Kitchen Difficulty breathing  . Swelling of face and throat  . A fast heartbeat  . A bad rash all over body  . Dizziness and weakness   Immunizations Administered    Name Date Dose VIS Date Route   Moderna COVID-19 Vaccine 02/17/2020 11:27 AM 0.5 mL 11/07/2019 Intramuscular   Manufacturer: Moderna   Lot: [id number]   NDC: 48250-037-04

## 2020-02-27 ENCOUNTER — Ambulatory Visit: Payer: Self-pay | Admitting: Physician Assistant

## 2020-02-27 ENCOUNTER — Other Ambulatory Visit (HOSPITAL_COMMUNITY)
Admission: RE | Admit: 2020-02-27 | Discharge: 2020-02-27 | Disposition: A | Discharge status: Home / Self Care | Payer: Medicaid Other | Source: Ambulatory Visit | Attending: Physician Assistant | Admitting: Physician Assistant

## 2020-02-27 ENCOUNTER — Encounter: Payer: Self-pay | Admitting: Physician Assistant

## 2020-02-27 DIAGNOSIS — E785 Hyperlipidemia, unspecified: Secondary | ICD-10-CM

## 2020-02-27 DIAGNOSIS — R7989 Other specified abnormal findings of blood chemistry: Secondary | ICD-10-CM

## 2020-02-27 DIAGNOSIS — R69 Illness, unspecified: Secondary | ICD-10-CM | POA: Diagnosis present

## 2020-02-27 DIAGNOSIS — J449 Chronic obstructive pulmonary disease, unspecified: Secondary | ICD-10-CM

## 2020-02-27 LAB — HEPATIC FUNCTION PANEL
ALT: 46 U/L — ABNORMAL HIGH (ref 0–44)
AST: 46 U/L — ABNORMAL HIGH (ref 15–41)
Albumin: 4.1 g/dL (ref 3.5–5.0)
Alkaline Phosphatase: 91 U/L (ref 38–126)
Bilirubin, Direct: 0.2 mg/dL (ref 0.0–0.2)
Indirect Bilirubin: 1.7 mg/dL — ABNORMAL HIGH (ref 0.3–0.9)
Total Bilirubin: 1.9 mg/dL — ABNORMAL HIGH (ref 0.3–1.2)
Total Protein: 7.6 g/dL (ref 6.5–8.1)

## 2020-02-27 NOTE — Progress Notes (Signed)
There were no vitals taken for this visit.   Subjective:    Patient ID: Adam Mercado, male    DOB: 11-Mar-1966, 54 y.o.   MRN: [id number]  HPI: Adam Mercado is a 54 y.o. male presenting on 02/27/2020 for No chief complaint on file.   HPI  This is a telemedicine appointment due to coronavirus pandemic.  It is via Telephone as pt does not have a video enabled device.    I connected with  Adam Mercado on 02/27/20 by a video enabled telemedicine application and verified that I am speaking with the correct person using two identifiers.   I discussed the limitations of evaluation and management by telemedicine. The patient expressed understanding and agreed to proceed.    Pt is 89yoM with COPD, dyslipidemia and heavy etoh consumption who has follow up today for recheck of elevated LFTs.  Pt's LFTs were noted to up significantly at last OV 1 month ago.  Pt says he has limited his etoh since that time and has avoided APAP.  He says he is feeling well.     Relevant past medical, surgical, family and social history reviewed and updated as indicated. Interim medical history since our last visit reviewed. Allergies and medications reviewed and updated.   Current Outpatient Medications:  .  albuterol (PROVENTIL HFA) 108 (90 Base) MCG/ACT inhaler, INHALE 2 PUFFS BY MOUTH EVERY 6 HOURS AS NEEDED FOR COUGHING, WHEEZING, OR SHORTNESS OF BREATH, Disp: 3 g, Rfl: 1 .  aspirin EC 81 MG EC tablet, Take 1 tablet (81 mg total) by mouth daily., Disp: 30 tablet, Rfl: 0 .  atorvastatin (LIPITOR) 20 MG tablet, Take 1 tablet (20 mg total) by mouth daily., Disp: 90 tablet, Rfl: 1 .  mometasone-formoterol (DULERA) 200-5 MCG/ACT AERO, Inhale 2 puffs into the lungs 2 (two) times daily., Disp: 3 g, Rfl: 1    Review of Systems  Per HPI unless specifically indicated above     Objective:    There were no vitals taken for this visit.  Wt Readings from Last 3 Encounters:  01/30/20 143 lb 12.8 oz (65.2 kg)   12/28/18 141 lb (64 kg)  11/22/18 144 lb 8 oz (65.5 kg)    Physical Exam Pulmonary:     Effort: No respiratory distress.  Neurological:     Mental Status: He is alert and oriented to person, place, and time.  Psychiatric:        Attention and Perception: Attention normal.        Speech: Speech normal.        Behavior: Behavior is cooperative.     Results for orders placed or performed during the hospital encounter of 02/27/20  Hepatic function panel  Result Value Ref Range   Total Protein 7.6 6.5 - 8.1 g/dL   Albumin 4.1 3.5 - 5.0 g/dL   AST 46 (H) 15 - 41 U/L   ALT 46 (H) 0 - 44 U/L   Alkaline Phosphatase 91 38 - 126 U/L   Total Bilirubin 1.9 (H) 0.3 - 1.2 mg/dL   Bilirubin, Direct 0.2 0.0 - 0.2 mg/dL   Indirect Bilirubin 1.7 (H) 0.3 - 0.9 mg/dL      Assessment & Plan:   Encounter Diagnoses  Name Primary?  . Elevated LFTs Yes  . Hyperlipidemia, unspecified hyperlipidemia type   . Chronic obstructive pulmonary disease, unspecified COPD type ([place])      -Reviewed labs with pt.  LFTs MUCH better!   Encouraged pt to stay off  etoh. -Pt already got 1st dose covid vaccine and has appt for second. -pt iFOBT unreadable/invalid due to too much sample in test kit -pt ot follow up 2 months for lipids.Marland Kitchen  He is to contact office sooner prn

## 2020-03-20 ENCOUNTER — Ambulatory Visit: Payer: Self-pay | Attending: Internal Medicine

## 2020-03-20 ENCOUNTER — Other Ambulatory Visit: Payer: Self-pay | Admitting: Physician Assistant

## 2020-03-20 DIAGNOSIS — Z23 Encounter for immunization: Secondary | ICD-10-CM

## 2020-03-20 NOTE — Progress Notes (Signed)
   Covid-19 Vaccination Clinic  Name:  Adam Mercado    MRN: [id number] DOB: Oct 29, 1966  03/20/2020  Adam Mercado was observed post Covid-19 immunization for 15 minutes without incident. He was provided with Vaccine Information Sheet and instruction to access the V-Safe system.   Adam Mercado was instructed to call 911 with any severe reactions post vaccine: Marland Kitchen Difficulty breathing  . Swelling of face and throat  . A fast heartbeat  . A bad rash all over body  . Dizziness and weakness   Immunizations Administered    Name Date Dose VIS Date Route   Moderna COVID-19 Vaccine 03/20/2020 11:14 AM 0.5 mL 11/07/2019 Intramuscular   Manufacturer: Moderna   Lot: [id number]   NDC: 41423-953-20

## 2020-04-04 ENCOUNTER — Other Ambulatory Visit: Payer: Self-pay | Admitting: Physician Assistant

## 2020-04-04 MED ORDER — ADVAIR HFA 230-21 MCG/ACT IN AERO
2.0000 | INHALATION_SPRAY | Freq: Two times a day (BID) | RESPIRATORY_TRACT | 3 refills | Status: DC
Start: 2020-04-04 — End: 2020-05-05

## 2020-04-25 ENCOUNTER — Other Ambulatory Visit: Payer: Self-pay | Admitting: Physician Assistant

## 2020-04-25 MED ORDER — FLUTICASONE-SALMETEROL 100-50 MCG/DOSE IN AEPB
1.0000 | INHALATION_SPRAY | Freq: Two times a day (BID) | RESPIRATORY_TRACT | 1 refills | Status: DC
Start: 2020-04-25 — End: 2020-07-30

## 2020-04-30 ENCOUNTER — Other Ambulatory Visit: Payer: Self-pay

## 2020-04-30 ENCOUNTER — Other Ambulatory Visit (HOSPITAL_COMMUNITY)
Admission: RE | Admit: 2020-04-30 | Discharge: 2020-04-30 | Disposition: A | Discharge status: Home / Self Care | Payer: Medicaid Other | Source: Ambulatory Visit | Attending: Physician Assistant | Admitting: Physician Assistant

## 2020-04-30 DIAGNOSIS — E785 Hyperlipidemia, unspecified: Secondary | ICD-10-CM | POA: Diagnosis present

## 2020-04-30 DIAGNOSIS — R69 Illness, unspecified: Secondary | ICD-10-CM | POA: Diagnosis present

## 2020-04-30 DIAGNOSIS — R7989 Other specified abnormal findings of blood chemistry: Secondary | ICD-10-CM | POA: Diagnosis present

## 2020-04-30 LAB — HEPATIC FUNCTION PANEL
ALT: 30 U/L (ref 0–44)
AST: 32 U/L (ref 15–41)
Albumin: 3.7 g/dL (ref 3.5–5.0)
Alkaline Phosphatase: 99 U/L (ref 38–126)
Bilirubin, Direct: 0.2 mg/dL (ref 0.0–0.2)
Indirect Bilirubin: 1.2 mg/dL — ABNORMAL HIGH (ref 0.3–0.9)
Total Bilirubin: 1.4 mg/dL — ABNORMAL HIGH (ref 0.3–1.2)
Total Protein: 7.2 g/dL (ref 6.5–8.1)

## 2020-04-30 LAB — LIPID PANEL
Cholesterol: 235 mg/dL — ABNORMAL HIGH (ref 0–200)
HDL: 88 mg/dL (ref >40)
LDL Cholesterol: 133 mg/dL — ABNORMAL HIGH (ref 0–99)
Total CHOL/HDL Ratio: 2.7 RATIO
Triglycerides: 70 mg/dL (ref <150)
VLDL: 14 mg/dL (ref 0–40)

## 2020-05-01 ENCOUNTER — Other Ambulatory Visit: Payer: Self-pay

## 2020-05-01 ENCOUNTER — Ambulatory Visit: Payer: Self-pay | Admitting: Physician Assistant

## 2020-05-01 ENCOUNTER — Encounter: Payer: Self-pay | Admitting: Physician Assistant

## 2020-05-01 VITALS — BP 100/68 | HR 75 | Temp 96.4°F | Wt 133.7 lb

## 2020-05-01 DIAGNOSIS — E785 Hyperlipidemia, unspecified: Secondary | ICD-10-CM

## 2020-05-01 DIAGNOSIS — F17200 Nicotine dependence, unspecified, uncomplicated: Secondary | ICD-10-CM

## 2020-05-01 DIAGNOSIS — F1910 Other psychoactive substance abuse, uncomplicated: Secondary | ICD-10-CM

## 2020-05-01 DIAGNOSIS — J449 Chronic obstructive pulmonary disease, unspecified: Secondary | ICD-10-CM

## 2020-05-01 NOTE — Progress Notes (Signed)
BP 100/68   Pulse 75   Temp (!) 96.4 F (35.8 C)   Wt 133 lb 11.2 oz (60.6 kg)   SpO2 97%   BMI 19.74 kg/m    Subjective:    Patient ID: Adam Mercado, male    DOB: 1966-03-13, 54 y.o.   MRN: [id number]  HPI: Adam Mercado is a 54 y.o. male presenting on 05/01/2020 for Hyperlipidemia   HPI  Pt had a negative covid 19 screening questionnaire.     Pt is a 54yoM with dyslipidemia, COPD.  He is feeling well and has no complaints.   He has not yet returned the iFOBT given to him in February for colon cancer screening.    Relevant past medical, surgical, family and social history reviewed and updated as indicated. Interim medical history since our last visit reviewed. Allergies and medications reviewed and updated.     Current Outpatient Medications:  .  albuterol (PROVENTIL HFA) 108 (90 Base) MCG/ACT inhaler, INHALE 2 PUFFS BY MOUTH EVERY 6 HOURS AS NEEDED FOR COUGHING, WHEEZING, OR SHORTNESS OF BREATH, Disp: 20.1 g, Rfl: 1 .  aspirin EC 81 MG EC tablet, Take 1 tablet (81 mg total) by mouth daily., Disp: 30 tablet, Rfl: 0 .  atorvastatin (LIPITOR) 20 MG tablet, TAKE 1 Tablet BY MOUTH ONCE EVERY DAY, Disp: 90 tablet, Rfl: 1 .  DULERA 200-5 MCG/ACT AERO, INHALE 2 PUFFS BY MOUTH TWICE DAILY. RINSE MOUTH AFTER EACH USE (Patient not taking: Reported on 05/01/2020), Disp: 39 g, Rfl: 1 .  Fluticasone-Salmeterol (ADVAIR DISKUS) 100-50 MCG/DOSE AEPB, Inhale 1 puff into the lungs in the morning and at bedtime. (Patient not taking: Reported on 05/01/2020), Disp: 60 each, Rfl: 1 .  fluticasone-salmeterol (ADVAIR HFA) 230-21 MCG/ACT inhaler, Inhale 2 puffs into the lungs 2 (two) times daily. (Patient not taking: Reported on 05/01/2020), Disp: 3 Inhaler, Rfl: 3     Review of Systems  Per HPI unless specifically indicated above     Objective:    BP 100/68   Pulse 75   Temp (!) 96.4 F (35.8 C)   Wt 133 lb 11.2 oz (60.6 kg)   SpO2 97%   BMI 19.74 kg/m   Wt Readings from Last 3  Encounters:  05/01/20 133 lb 11.2 oz (60.6 kg)  01/30/20 143 lb 12.8 oz (65.2 kg)  12/28/18 141 lb (64 kg)    Physical Exam  Results for orders placed or performed during the hospital encounter of 04/30/20  Hepatic function panel  Result Value Ref Range   Total Protein 7.2 6.5 - 8.1 g/dL   Albumin 3.7 3.5 - 5.0 g/dL   AST 32 15 - 41 U/L   ALT 30 0 - 44 U/L   Alkaline Phosphatase 99 38 - 126 U/L   Total Bilirubin 1.4 (H) 0.3 - 1.2 mg/dL   Bilirubin, Direct 0.2 0.0 - 0.2 mg/dL   Indirect Bilirubin 1.2 (H) 0.3 - 0.9 mg/dL  Lipid panel  Result Value Ref Range   Cholesterol 235 (H) 0 - 200 mg/dL   Triglycerides 70 <725 mg/dL   HDL 88 >36 mg/dL   Total CHOL/HDL Ratio 2.7 RATIO   VLDL 14 0 - 40 mg/dL   LDL Cholesterol 644 (H) 0 - 99 mg/dL      Assessment & Plan:     Encounter Diagnoses  Name Primary?  . Hyperlipidemia, unspecified hyperlipidemia type Yes  . Chronic obstructive pulmonary disease, unspecified COPD type (HCC)   . Tobacco use disorder   .  Substance abuse ([place])      -reviewed labs with pt  -pt counseled to Get back to lowfat diet.  Will continue his atorvastatin -as [facility] no longer has dulera, he was prescribed advair discus.  He was Counseled on how to properly use the adviar discus -pt reminded to return iFOBT -pt to follow up 3 months he is to contact office sooner prn

## 2020-07-29 ENCOUNTER — Other Ambulatory Visit (HOSPITAL_COMMUNITY)
Admission: RE | Admit: 2020-07-29 | Discharge: 2020-07-29 | Disposition: A | Discharge status: Home / Self Care | Payer: Medicaid Other | Source: Ambulatory Visit | Attending: Physician Assistant | Admitting: Physician Assistant

## 2020-07-29 DIAGNOSIS — E785 Hyperlipidemia, unspecified: Secondary | ICD-10-CM | POA: Diagnosis not present

## 2020-07-29 DIAGNOSIS — R69 Illness, unspecified: Secondary | ICD-10-CM | POA: Diagnosis present

## 2020-07-29 LAB — COMPREHENSIVE METABOLIC PANEL
ALT: 28 U/L (ref 0–44)
AST: 38 U/L (ref 15–41)
Albumin: 3.9 g/dL (ref 3.5–5.0)
Alkaline Phosphatase: 80 U/L (ref 38–126)
Anion gap: 9 (ref 5–15)
BUN: 14 mg/dL (ref 6–20)
CO2: 24 mmol/L (ref 22–32)
Calcium: 8.8 mg/dL — ABNORMAL LOW (ref 8.9–10.3)
Chloride: 103 mmol/L (ref 98–111)
Creatinine, Ser: 0.95 mg/dL (ref 0.61–1.24)
GFR calc Af Amer: >60 mL/min (ref >60)
GFR calc non Af Amer: >60 mL/min (ref >60)
Glucose, Bld: 110 mg/dL — ABNORMAL HIGH (ref 70–99)
Potassium: 3.8 mmol/L (ref 3.5–5.1)
Sodium: 136 mmol/L (ref 135–145)
Total Bilirubin: 1.4 mg/dL — ABNORMAL HIGH (ref 0.3–1.2)
Total Protein: 7.3 g/dL (ref 6.5–8.1)

## 2020-07-29 LAB — LIPID PANEL
Cholesterol: 217 mg/dL — ABNORMAL HIGH (ref 0–200)
HDL: 86 mg/dL (ref >40)
LDL Cholesterol: 117 mg/dL — ABNORMAL HIGH (ref 0–99)
Total CHOL/HDL Ratio: 2.5 RATIO
Triglycerides: 69 mg/dL (ref <150)
VLDL: 14 mg/dL (ref 0–40)

## 2020-07-30 ENCOUNTER — Other Ambulatory Visit: Payer: Self-pay | Admitting: Physician Assistant

## 2020-07-30 MED ORDER — ATORVASTATIN CALCIUM 20 MG PO TABS
ORAL_TABLET | ORAL | 0 refills | Status: DC
Start: 2020-07-30 — End: 2023-12-02

## 2020-07-30 MED ORDER — FLUTICASONE-SALMETEROL 100-50 MCG/DOSE IN AEPB
1.0000 | INHALATION_SPRAY | Freq: Two times a day (BID) | RESPIRATORY_TRACT | 0 refills | Status: DC
Start: 2020-07-30 — End: 2023-12-02

## 2020-07-30 MED ORDER — ALBUTEROL SULFATE HFA 108 (90 BASE) MCG/ACT IN AERS
INHALATION_SPRAY | RESPIRATORY_TRACT | 1 refills | Status: AC
Start: 2020-07-30 — End: ?

## 2020-07-31 ENCOUNTER — Ambulatory Visit: Payer: Self-pay | Admitting: Physician Assistant

## 2021-02-03 ENCOUNTER — Encounter: Payer: Self-pay | Admitting: Internal Medicine

## 2021-03-13 ENCOUNTER — Ambulatory Visit: Payer: Medicaid Other | Admitting: Nurse Practitioner

## 2021-03-13 ENCOUNTER — Encounter: Payer: Self-pay | Admitting: Internal Medicine

## 2021-05-28 ENCOUNTER — Encounter: Payer: Self-pay | Admitting: *Deleted

## 2021-05-28 ENCOUNTER — Telehealth: Payer: Self-pay | Admitting: *Deleted

## 2021-05-28 ENCOUNTER — Other Ambulatory Visit: Payer: Self-pay

## 2021-05-28 ENCOUNTER — Encounter: Payer: Self-pay | Admitting: Gastroenterology

## 2021-05-28 ENCOUNTER — Ambulatory Visit (INDEPENDENT_AMBULATORY_CARE_PROVIDER_SITE_OTHER): Payer: Medicaid Other | Admitting: Gastroenterology

## 2021-05-28 VITALS — BP 113/75 | HR 78 | Temp 97.3°F | Ht 69.0 in | Wt 123.4 lb

## 2021-05-28 DIAGNOSIS — F1410 Cocaine abuse, uncomplicated: Secondary | ICD-10-CM | POA: Diagnosis not present

## 2021-05-28 DIAGNOSIS — Z1211 Encounter for screening for malignant neoplasm of colon: Secondary | ICD-10-CM

## 2021-05-28 DIAGNOSIS — R634 Abnormal weight loss: Secondary | ICD-10-CM

## 2021-05-28 MED ORDER — CLENPIQ 10-3.5-12 MG-GM -GM/160ML PO SOLN: 1.0000 | Freq: Once | ORAL | 0 refills | Status: AC

## 2021-05-28 NOTE — Telephone Encounter (Signed)
PA approved via [facility] for TCS/EGD. Auth# [id number], DOS Jun 30, 2021 - Sep 28, 2021

## 2021-05-28 NOTE — Progress Notes (Signed)
  Referring Provider: Kim, James, MD Primary Care Physician:  Kim, James, MD Primary GI Physician: Dr. Carver   Chief Complaint  Patient presents with   Consult    TCS never done prior. No FHX colon cancer, polyps. Occas will have blood in stool (not often)    HPI:   Adam Mercado is a 55 y.o. male presenting today to schedule screening colonoscopy. Significant history of alcohol abuse. Drinks two 40 oz of beer/day and 1 pint of liquor every few days.    Notes every once in awhile low-volume hematochezia every few months for past few years. Last seen 6 months ago. No constipation. No straining. No changes in bowel habits. No GERD or dysphagia. Appetite is not great. Appetite poor for "years". No N/V. Snorts cocaine. No IV drug use.   Weight high of 147 in 2016, 2019 was 144, 2021 133, today 123. Weight loss unintentionally.     Last Colonoscopy: no previous colonoscopy  Last Endoscopy: No previous endoscopy     Past Medical History:  Diagnosis Date   Tuberculosis     Past Surgical History:  Procedure Laterality Date   CARDIAC CATHETERIZATION N/A 08/24/2016   Procedure: Left Heart Cath and Coronary Angiography;  Surgeon: Peter M Jordan, MD;  Location: [facility] INVASIVE CV LAB;  Service: Cardiovascular;  Laterality: N/A;   FINGER SURGERY      Current Outpatient Medications  Medication Sig Dispense Refill   albuterol (PROVENTIL HFA) 108 (90 Base) MCG/ACT inhaler INHALE 2 PUFFS BY MOUTH EVERY 6 HOURS AS NEEDED FOR COUGHING, WHEEZING, OR SHORTNESS OF BREATH 20.1 g 1   aspirin EC 81 MG EC tablet Take 1 tablet (81 mg total) by mouth daily. 30 tablet 0   atorvastatin (LIPITOR) 20 MG tablet TAKE 1 Tablet BY MOUTH ONCE EVERY DAY 30 tablet 0   Fluticasone-Salmeterol (ADVAIR DISKUS) 100-50 MCG/DOSE AEPB Inhale 1 puff into the lungs in the morning and at bedtime. 60 each 0   No current facility-administered medications for this visit.    Allergies as of 05/28/2021   (No Known  Allergies)    Family History  Problem Relation Age of Onset   Diabetes Brother    Hypertension Mother    Kidney disease Mother    Hypertension Father    Stroke Father    Stroke Sister     Social History   Socioeconomic History   Marital status: Single    Spouse name: Not on file   Number of children: Not on file   Years of education: Not on file   Highest education level: Not on file  Occupational History   Not on file  Tobacco Use   Smoking status: Every Day    Packs/day: 0.25    Years: 30.00    Pack years: 7.50    Types: Cigarettes    Last attempt to quit: 12/08/2019    Years since quitting: 1.4   Smokeless tobacco: Never  Vaping Use   Vaping Use: Never used  Substance and Sexual Activity   Alcohol use: Not Currently    Comment: 5th or 2 in 7 days, 40 ounces beer daily   Drug use: Yes    Types: Cocaine, Marijuana   Sexual activity: Not on file  Other Topics Concern   Not on file  Social History Narrative   Not on file   Social Determinants of Health   Financial Resource Strain: Not on file  Food Insecurity: Not on file  Transportation Needs: Not on   file  Physical Activity: Not on file  Stress: Not on file  Social Connections: Not on file    Review of Systems: Gen: Denies fever, chills, anorexia. Denies fatigue, weakness, weight loss.  CV: Denies chest pain, palpitations, syncope, peripheral edema, and claudication. Resp: Denies dyspnea at rest, cough, wheezing, coughing up blood, and pleurisy. GI: Denies vomiting blood, jaundice, and fecal incontinence. Denies dysphagia or odynophagia. Derm: Denies rash, itching, dry skin Psych: Denies depression, anxiety, memory loss, confusion. No homicidal or suicidal ideation.  Heme: Denies bruising, bleeding, and enlarged lymph nodes.  Physical Exam: BP 113/75   Pulse 78   Temp (!) 97.3 F (36.3 C)   Ht 5' 9" (1.753 m)   Wt 123 lb 6.4 oz (56 kg)   BMI 18.22 kg/m  General:   Alert and oriented. No distress  noted. Pleasant and cooperative.  Head:  Normocephalic and atraumatic. Eyes:  Conjuctiva clear without scleral icterus. Mouth:  Oral mucosa pink and moist. Good dentition. No lesions. Heart: Normal rate and rhythm, s1 and s2 heart sounds present.  Lungs: Clear lung sounds in all lobes. Respirations equal and unlabored. Abdomen:  +BS, soft, non-tender and non-distended. No rebound or guarding. No HSM or masses noted. Derm: No palmar erythema or jaundice Msk:  Symmetrical without gross deformities. Normal posture. Extremities:  Without edema. Neurologic:  Alert and  oriented x4 Psych:  Alert and cooperative. Normal mood and affect.  Outside labs Feb 2022: Hgb 13.8, Hct 40.7, Platelets 308, WBC count 7.1, Tbili 1.1, Alk Phos 92, AST 33, ALT 22. TSH 2.360.   ASSESSMENT: Adam Mercado is a 55 y.o. male presenting today to schedule screening colonoscopy but was brought in for visit due to ETOH abuse and drug abuse (cocaine).   He uses cocaine intermittently and drinks approximately two 40 ounces of beer a day and pint of liquor every few days, stating he shares with a friend.   No family history of colorectal cancer or polyps; however, he does note chronic history of low-volume hematochezia sporadically but no changes in bowel habits, abdominal pain.   Concerning is unintentional weight loss, weighing a high of 147 in 2016-2019 and down to 133 in 2021, today 123. Appetite is poor. Likely multifactorial in setting of drug and ETOH abuse. Unable to exclude occult malignancy. We discussed abstinence today and informed procedures would be cancelled if positive drug screen. We also discussed detrimental effects of alcohol abuse on his liver; no imaging to indicate cirrhosis. Outside labs without thrombocytopenia. LFTs normal.     PLAN:  Proceed with colonoscopy/EGD due to low-volume hematochezia and loss of appetite/weight loss by Dr. Carver  in near future: the risks, benefits, and alternatives have  been discussed with the patient in detail. The patient states understanding and desires to proceed (ASA 3)    High risk screening Hep C antibody  Drug screen prior  Avoid drugs/alcohol  Follow-up thereafter   Anna W. Boone, PhD, ANP-BC [facility]           

## 2021-05-28 NOTE — Patient Instructions (Signed)
We are arranging a colonoscopy and upper endoscopy with Dr. Marletta Lor in the near future.  You will have to have a clean drug screen prior to the procedure, and we will test this prior.  Please have blood work done!  We will see you back after the procedures!

## 2021-05-29 ENCOUNTER — Encounter: Payer: Self-pay | Admitting: *Deleted

## 2021-06-25 NOTE — Patient Instructions (Signed)
Adam Mercado  06/25/2021     @PREFPERIOPPHARMACY @   Your procedure is scheduled on   06/30/2021.   Report to [facility] at  06/30/2021  A.M.   Call this number if you have problems the morning of surgery:  [phone]   Remember:  Follow the diet and prep instructions given to you by the office.         Take these medicines the morning of surgery with A SIP OF WATER      NONE.     Do not wear jewelry, make-up or nail polish.  Do not wear lotions, powders, or perfumes, or deodorant.  Do not shave 48 hours prior to surgery.  Men may shave face and neck.  Do not bring valuables to the hospital.  [facility] is not responsible for any belongings or valuables.  Contacts, dentures or bridgework may not be worn into surgery.  Leave your suitcase in the car.  After surgery it may be brought to your room.  For patients admitted to the hospital, discharge time will be determined by your treatment team.  Patients discharged the day of surgery will not be allowed to drive home and must have someone with them for 24 hours.    Special instructions:     DO NOT smoke tobacco or vape for 24 hours before your procedure.  Please read over the following fact sheets that you were given. Anesthesia Post-op Instructions and Care and Recovery After Surgery      Upper Endoscopy, Adult, Care After This sheet gives you information about how to care for yourself after your procedure. Your health care provider may also give you more specific instructions. If you have problems or questions, contact your health careprovider. What can I expect after the procedure? After the procedure, it is common to have: A sore throat. Mild stomach pain or discomfort. Bloating. Nausea. Follow these instructions at home:  Follow instructions from your health care provider about what to eat or drink after your procedure. Return to your normal activities as told by your health care provider. Ask your  health care provider what activities are safe for you. Take over-the-counter and prescription medicines only as told by your health care provider. If you were given a sedative during the procedure, it can affect you for several hours. Do not drive or operate machinery until your health care provider says that it is safe. Keep all follow-up visits as told by your health care provider. This is important. Contact a health care provider if you have: A sore throat that lasts longer than one day. Trouble swallowing. Get help right away if: You vomit blood or your vomit looks like coffee grounds. You have: A fever. Bloody, black, or tarry stools. A severe sore throat or you cannot swallow. Difficulty breathing. Severe pain in your chest or abdomen. Summary After the procedure, it is common to have a sore throat, mild stomach discomfort, bloating, and nausea. If you were given a sedative during the procedure, it can affect you for several hours. Do not drive or operate machinery until your health care provider says that it is safe. Follow instructions from your health care provider about what to eat or drink after your procedure. Return to your normal activities as told by your health care provider. This information is not intended to replace advice given to you by your health care provider. Make sure you discuss any questions you have with your  healthcare provider. Document Revised: 11/21/2019 Document Reviewed: 04/25/2018 Elsevier Patient Education  2022 Else[employer] Colonoscopy, Adult, Care After This sheet gives you information about how to care for yourself after your procedure. Your health care provider may also give you more specific instructions. If you have problems or questions, contact your health careprovider. What can I expect after the procedure? After the procedure, it is common to have: A small amount of blood in your stool for 24 hours after the procedure. Some gas. Mild  cramping or bloating of your abdomen. Follow these instructions at home: Eating and drinking  Drink enough fluid to keep your urine pale yellow. Follow instructions from your health care provider about eating or drinking restrictions. Resume your normal diet as instructed by your health care provider. Avoid heavy or fried foods that are hard to digest.  Activity Rest as told by your health care provider. Avoid sitting for a long time without moving. Get up to take short walks every 1-2 hours. This is important to improve blood flow and breathing. Ask for help if you feel weak or unsteady. Return to your normal activities as told by your health care provider. Ask your health care provider what activities are safe for you. Managing cramping and bloating  Try walking around when you have cramps or feel bloated. Apply heat to your abdomen as told by your health care provider. Use the heat source that your health care provider recommends, such as a moist heat pack or a heating pad. Place a towel between your skin and the heat source. Leave the heat on for 20-30 minutes. Remove the heat if your skin turns bright red. This is especially important if you are unable to feel pain, heat, or cold. You may have a greater risk of getting burned.  General instructions If you were given a sedative during the procedure, it can affect you for several hours. Do not drive or operate machinery until your health care provider says that it is safe. For the first 24 hours after the procedure: Do not sign important documents. Do not drink alcohol. Do your regular daily activities at a slower pace than normal. Eat soft foods that are easy to digest. Take over-the-counter and prescription medicines only as told by your health care provider. Keep all follow-up visits as told by your health care provider. This is important. Contact a health care provider if: You have blood in your stool 2-3 days after the  procedure. Get help right away if you have: More than a small spotting of blood in your stool. Large blood clots in your stool. Swelling of your abdomen. Nausea or vomiting. A fever. Increasing pain in your abdomen that is not relieved with medicine. Summary After the procedure, it is common to have a small amount of blood in your stool. You may also have mild cramping and bloating of your abdomen. If you were given a sedative during the procedure, it can affect you for several hours. Do not drive or operate machinery until your health care provider says that it is safe. Get help right away if you have a lot of blood in your stool, nausea or vomiting, a fever, or increased pain in your abdomen. This information is not intended to replace advice given to you by your health care provider. Make sure you discuss any questions you have with your healthcare provider. Document Revised: 11/17/2019 Document Reviewed: 06/19/2019 Elsevier Patient Education  2022 Else[employer] Inc. Monitored Anesthesia Care, Care After This sheet  gives you information about how to care for yourself after your procedure. Your health care provider may also give you more specific instructions. If you have problems or questions, contact your health careprovider. What can I expect after the procedure? After the procedure, it is common to have: Tiredness. Forgetfulness about what happened after the procedure. Impaired judgment for important decisions. Nausea or vomiting. Some difficulty with balance. Follow these instructions at home: For the time period you were told by your health care provider:     Rest as needed. Do not participate in activities where you could fall or become injured. Do not drive or use machinery. Do not drink alcohol. Do not take sleeping pills or medicines that cause drowsiness. Do not make important decisions or sign legal documents. Do not take care of children on your own. Eating and  drinking Follow the diet that is recommended by your health care provider. Drink enough fluid to keep your urine pale yellow. If you vomit: Drink water, juice, or soup when you can drink without vomiting. Make sure you have little or no nausea before eating solid foods. General instructions Have a responsible adult stay with you for the time you are told. It is important to have someone help care for you until you are awake and alert. Take over-the-counter and prescription medicines only as told by your health care provider. If you have sleep apnea, surgery and certain medicines can increase your risk for breathing problems. Follow instructions from your health care provider about wearing your sleep device: Anytime you are sleeping, including during daytime naps. While taking prescription pain medicines, sleeping medicines, or medicines that make you drowsy. Avoid smoking. Keep all follow-up visits as told by your health care provider. This is important. Contact a health care provider if: You keep feeling nauseous or you keep vomiting. You feel light-headed. You are still sleepy or having trouble with balance after 24 hours. You develop a rash. You have a fever. You have redness or swelling around the IV site. Get help right away if: You have trouble breathing. You have new-onset confusion at home. Summary For several hours after your procedure, you may feel tired. You may also be forgetful and have poor judgment. Have a responsible adult stay with you for the time you are told. It is important to have someone help care for you until you are awake and alert. Rest as told. Do not drive or operate machinery. Do not drink alcohol or take sleeping pills. Get help right away if you have trouble breathing, or if you suddenly become confused. This information is not intended to replace advice given to you by your health care provider. Make sure you discuss any questions you have with your  healthcare provider. Document Revised: 08/08/2020 Document Reviewed: 10/26/2019 Elsevier Patient Education  2022 [employer].

## 2021-06-26 ENCOUNTER — Telehealth: Payer: Self-pay | Admitting: *Deleted

## 2021-06-26 ENCOUNTER — Encounter: Payer: Self-pay | Admitting: *Deleted

## 2021-06-26 ENCOUNTER — Encounter (HOSPITAL_COMMUNITY)
Admission: RE | Admit: 2021-06-26 | Discharge: 2021-06-26 | Disposition: A | Discharge status: Home / Self Care | Payer: Medicaid Other | Source: Ambulatory Visit | Attending: Internal Medicine | Admitting: Internal Medicine

## 2021-06-26 ENCOUNTER — Encounter (HOSPITAL_COMMUNITY): Payer: Medicaid Other

## 2021-06-26 MED ORDER — CLENPIQ 10-3.5-12 MG-GM -GM/160ML PO SOLN: 1.0000 | Freq: Once | ORAL | 0 refills | Status: AC

## 2021-06-26 NOTE — Telephone Encounter (Signed)
Called pt and he states he forgot about it. He wanted to reschedule. He has been rescheduled to 8/29 at 8:30am. Message sent to endo making aware. New instructions and new pre-op appt mailed.

## 2021-06-26 NOTE — Telephone Encounter (Signed)
-----   Message from Elsie Amis, RN sent at 06/26/2021 11:35 AM EDT ----- Regarding: no show Good morning! Adam Mercado did not show for his PAT this morning.

## 2021-07-29 NOTE — Patient Instructions (Signed)
Your procedure is scheduled on: 08/04/2021  Report to [facility] at    6:45 AM.  Call this number if you have problems the morning of surgery: [phone]   Remember:              Follow Directions on the letter you received from Your Physician's office regarding the Bowel Prep              No Smoking the day of Procedure :   Take these medicines the morning of surgery with A SIP OF WATER: use inhalers   Do not wear jewelry, make-up or nail polish.    Do not bring valuables to the hospital.  Contacts, dentures or bridgework may not be worn into surgery.  .   Patients discharged the day of surgery will not be allowed to drive home.     Colonoscopy, Adult, Care After This sheet gives you information about how to care for yourself after your procedure. Your health care provider may also give you more specific instructions. If you have problems or questions, contact your health care provider. What can I expect after the procedure? After the procedure, it is common to have: A small amount of blood in your stool for 24 hours after the procedure. Some gas. Mild abdominal cramping or bloating.  Follow these instructions at home: General instructions  For the first 24 hours after the procedure: Do not drive or use machinery. Do not sign important documents. Do not drink alcohol. Do your regular daily activities at a slower pace than normal. Eat soft, easy-to-digest foods. Rest often. Take over-the-counter or prescription medicines only as told by your health care provider. It is up to you to get the results of your procedure. Ask your health care provider, or the department performing the procedure, when your results will be ready. Relieving cramping and bloating Try walking around when you have cramps or feel bloated. Apply heat to your abdomen as told by your health care provider. Use a heat source that your health care provider recommends, such as a moist heat pack or a  heating pad. Place a towel between your skin and the heat source. Leave the heat on for 20-30 minutes. Remove the heat if your skin turns bright red. This is especially important if you are unable to feel pain, heat, or cold. You may have a greater risk of getting burned. Eating and drinking Drink enough fluid to keep your urine clear or pale yellow. Resume your normal diet as instructed by your health care provider. Avoid heavy or fried foods that are hard to digest. Avoid drinking alcohol for as long as instructed by your health care provider. Contact a health care provider if: You have blood in your stool 2-3 days after the procedure. Get help right away if: You have more than a small spotting of blood in your stool. You pass large blood clots in your stool. Your abdomen is swollen. You have nausea or vomiting. You have a fever. You have increasing abdominal pain that is not relieved with medicine. This information is not intended to replace advice given to you by your health care provider. Make sure you discuss any questions you have with your health care provider. Document Released: 07/07/2004 Document Revised: 08/17/2016 Document Reviewed: 02/04/2016 Elsevier Interactive Patient Education  2018 Elsevier Inc.  Upper Endoscopy, Adult, Care After This sheet gives you information about how to care for yourself after your procedure. Your health care provider may also give  you more specific instructions. If you have problems or questions, contact your health careprovider. What can I expect after the procedure? After the procedure, it is common to have: A sore throat. Mild stomach pain or discomfort. Bloating. Nausea. Follow these instructions at home:  Follow instructions from your health care provider about what to eat or drink after your procedure. Return to your normal activities as told by your health care provider. Ask your health care provider what activities are safe for  you. Take over-the-counter and prescription medicines only as told by your health care provider. If you were given a sedative during the procedure, it can affect you for several hours. Do not drive or operate machinery until your health care provider says that it is safe. Keep all follow-up visits as told by your health care provider. This is important. Contact a health care provider if you have: A sore throat that lasts longer than one day. Trouble swallowing. Get help right away if: You vomit blood or your vomit looks like coffee grounds. You have: A fever. Bloody, black, or tarry stools. A severe sore throat or you cannot swallow. Difficulty breathing. Severe pain in your chest or abdomen. Summary After the procedure, it is common to have a sore throat, mild stomach discomfort, bloating, and nausea. If you were given a sedative during the procedure, it can affect you for several hours. Do not drive or operate machinery until your health care provider says that it is safe. Follow instructions from your health care provider about what to eat or drink after your procedure. Return to your normal activities as told by your health care provider. This information is not intended to replace advice given to you by your health care provider. Make sure you discuss any questions you have with your healthcare provider. Document Revised: 11/21/2019 Document Reviewed: 04/25/2018 Elsevier Patient Education  2022 [employer].

## 2021-07-31 ENCOUNTER — Encounter (HOSPITAL_COMMUNITY): Payer: Self-pay

## 2021-07-31 ENCOUNTER — Encounter (HOSPITAL_COMMUNITY)
Admission: RE | Admit: 2021-07-31 | Discharge: 2021-07-31 | Disposition: A | Discharge status: Home / Self Care | Payer: Medicaid Other | Source: Ambulatory Visit | Attending: Internal Medicine | Admitting: Internal Medicine

## 2021-07-31 ENCOUNTER — Telehealth: Payer: Self-pay | Admitting: *Deleted

## 2021-07-31 NOTE — Telephone Encounter (Signed)
Called pt and male answered. Was advised pt was no available. LM for him to call us

## 2021-07-31 NOTE — Pre-Procedure Instructions (Signed)
Patient was no show for his PAT this morning. Interoffice message sent to Mindy at [facility].

## 2021-07-31 NOTE — Telephone Encounter (Signed)
-----   Message from Elsie Amis, RN sent at 07/31/2021 11:03 AM EDT ----- Regarding: no show Its me again! Randall Hiss did not show for his PAT this morning.

## 2021-07-31 NOTE — Telephone Encounter (Signed)
This is second no show for pre-op/covid test for procedure.  Called pt, no answer. Procedure will be cancelled for Monday.  FYI also to Dr. Marletta Lor

## 2021-08-04 ENCOUNTER — Ambulatory Visit (HOSPITAL_COMMUNITY): Admission: RE | Admit: 2021-08-04 | Payer: Medicaid Other | Source: Home / Self Care

## 2021-08-04 ENCOUNTER — Encounter (HOSPITAL_COMMUNITY): Admission: RE | Payer: Self-pay | Source: Home / Self Care

## 2021-08-04 SURGERY — COLONOSCOPY WITH PROPOFOL
Anesthesia: Monitor Anesthesia Care

## 2021-08-05 NOTE — Telephone Encounter (Signed)
Called pt, no answer and not able to leave VM. Letter mailed

## 2022-04-21 ENCOUNTER — Other Ambulatory Visit (HOSPITAL_COMMUNITY): Payer: Self-pay | Admitting: Adult Health

## 2022-04-21 ENCOUNTER — Ambulatory Visit (HOSPITAL_COMMUNITY)
Admission: RE | Admit: 2022-04-21 | Discharge: 2022-04-21 | Disposition: A | Discharge status: Home / Self Care | Payer: Medicaid Other | Source: Ambulatory Visit | Attending: Adult Health | Admitting: Adult Health

## 2022-04-21 DIAGNOSIS — R059 Cough, unspecified: Secondary | ICD-10-CM | POA: Diagnosis present

## 2022-10-01 IMAGING — DX DG CHEST 2V
2 series · 2 of 2 positions shown · non-contrast
Comparison: Chest radiograph 09/28/2018.

CLINICAL DATA: Cough

EXAM:
CHEST - 2 VIEW

[chest pa]
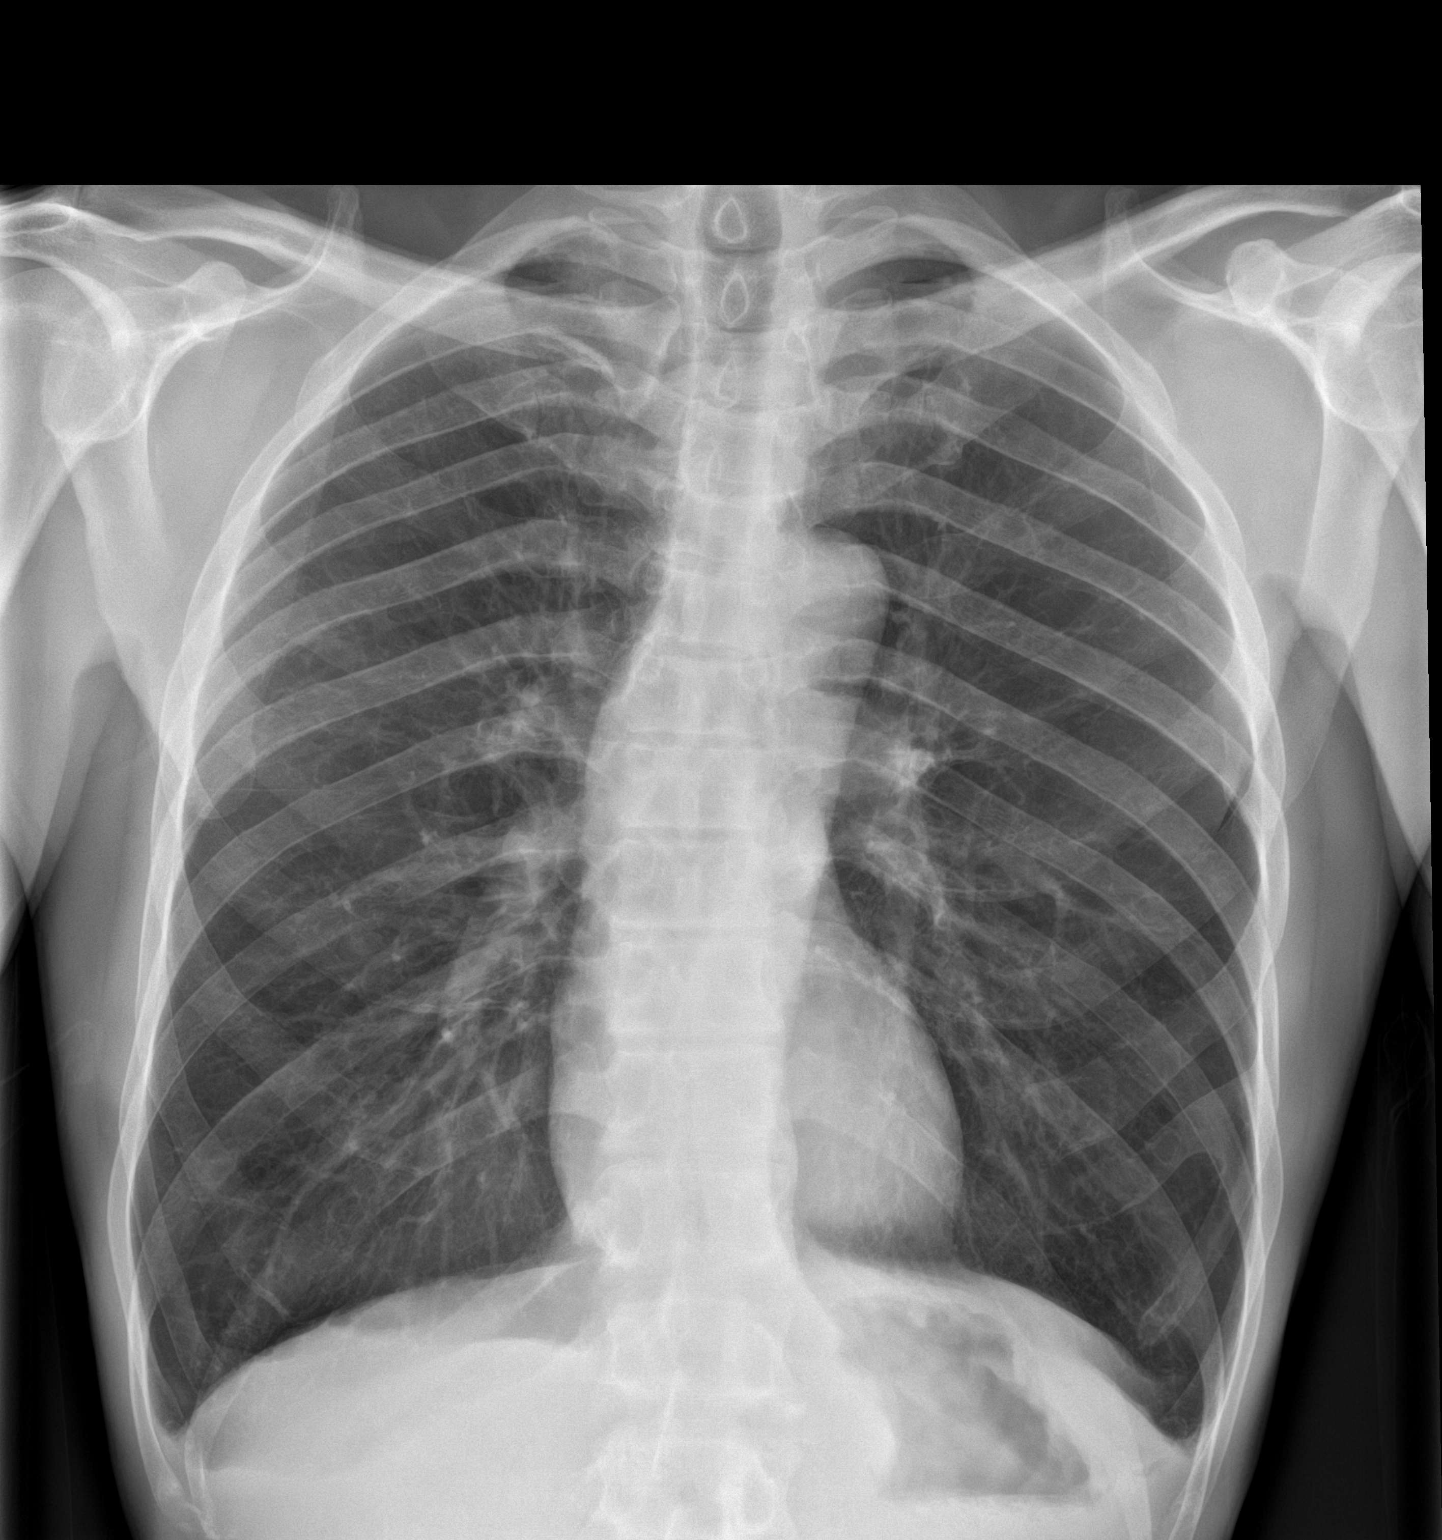

[chest lat]
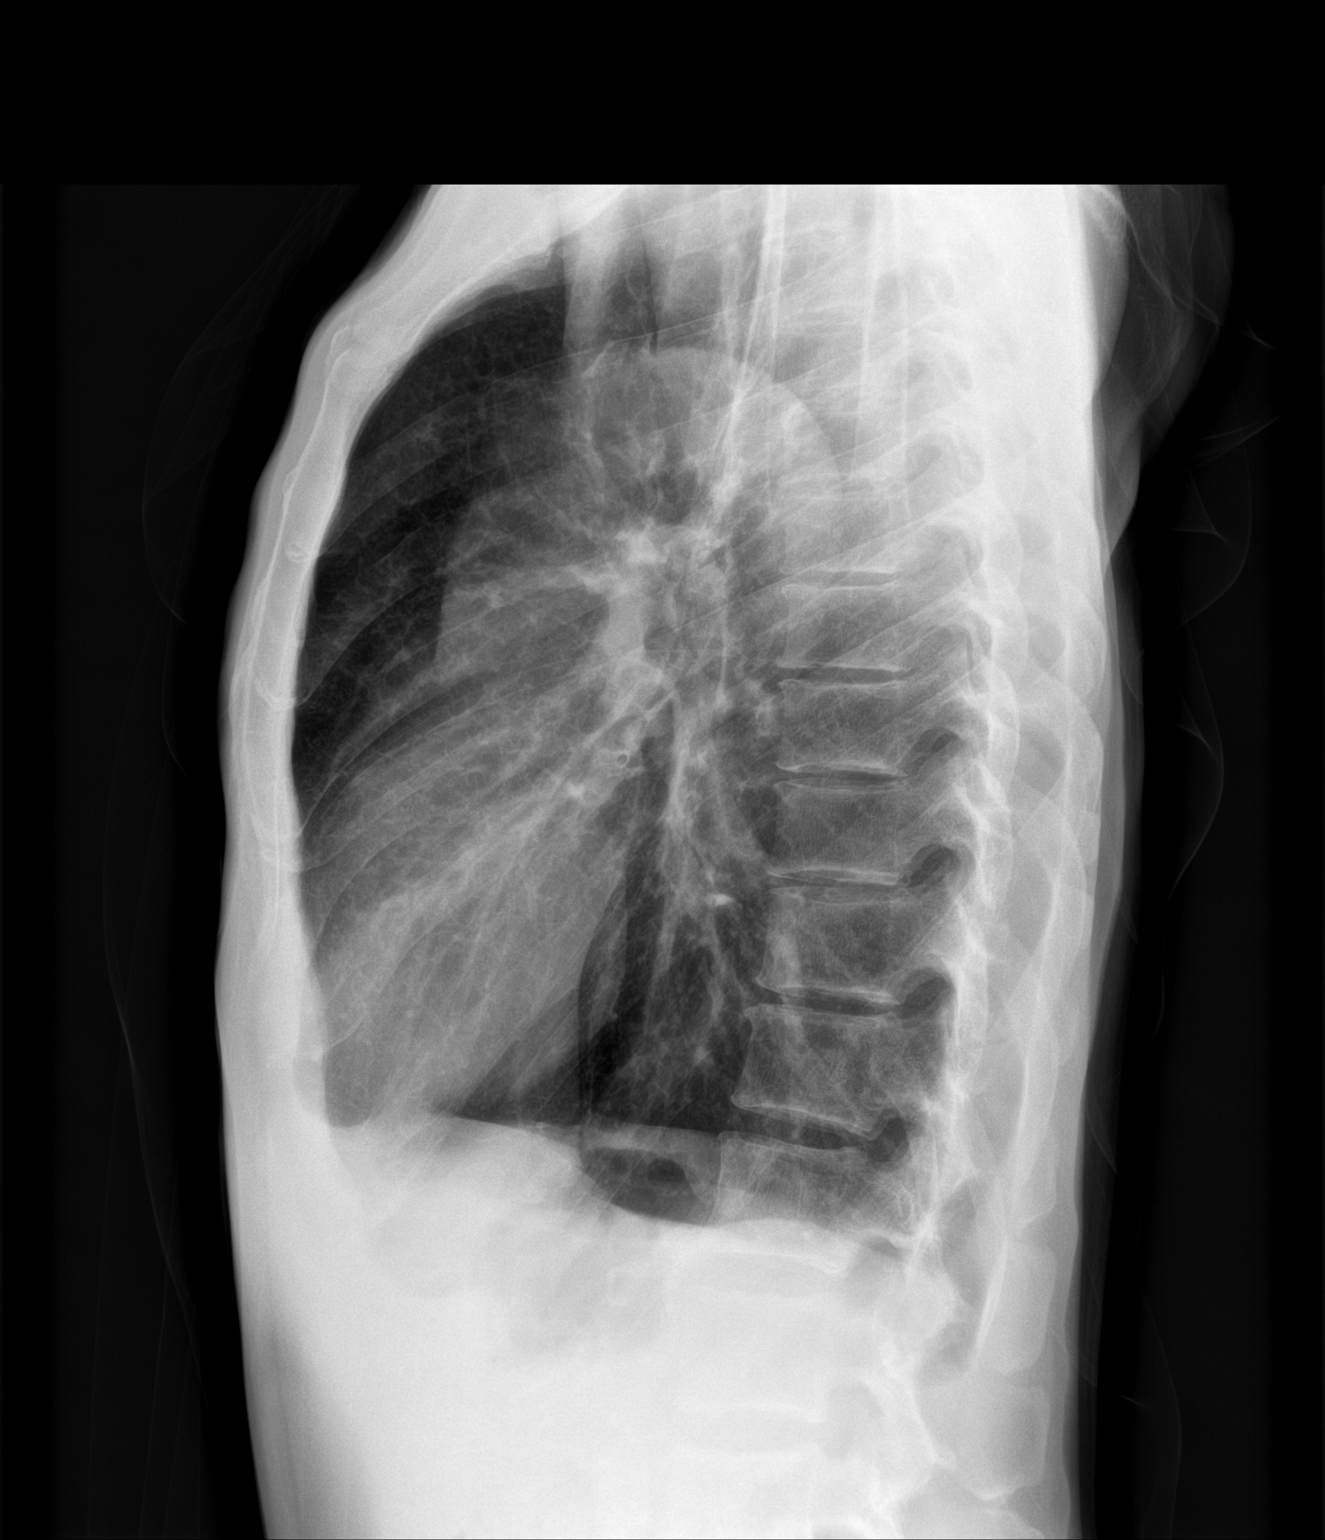

[2 of 2 positions shown; findings below may reference images not displayed]

FINDINGS: Stable cardiac and mediastinal contours. No consolidative pulmonary
opacities. No pleural effusion or pneumothorax. Osseous structures
unremarkable.
IMPRESSION: No active cardiopulmonary disease.

## 2023-05-19 ENCOUNTER — Encounter: Payer: Self-pay | Admitting: *Deleted

## 2023-11-18 ENCOUNTER — Encounter: Payer: Self-pay | Admitting: *Deleted

## 2023-11-21 NOTE — Progress Notes (Unsigned)
   Adam Mercado, male    DOB: [birth year]    MRN: [id number]   Brief patient profile:  57  yo*** *** referred to pulmonary clinic in [place]  11/22/2023 by *** for ***      History of Present Illness  11/22/2023  Pulmonary/ 1st office eval/ Sherene Sires / [place] [facility]  No chief complaint on file.    Dyspnea:  *** Cough: *** Sleep: *** SABA use: *** 02: *** Lung cancer screen: ***   Outpatient Medications Prior to Visit  Medication Sig Dispense Refill   albuterol (PROVENTIL HFA) 108 (90 Base) MCG/ACT inhaler INHALE 2 PUFFS BY MOUTH EVERY 6 HOURS AS NEEDED FOR COUGHING, WHEEZING, OR SHORTNESS OF BREATH 20.1 g 1   aspirin EC 81 MG EC tablet Take 1 tablet (81 mg total) by mouth daily. 30 tablet 0   atorvastatin (LIPITOR) 20 MG tablet TAKE 1 Tablet BY MOUTH ONCE EVERY DAY 30 tablet 0   Fluticasone-Salmeterol (ADVAIR DISKUS) 100-50 MCG/DOSE AEPB Inhale 1 puff into the lungs in the morning and at bedtime. 60 each 0   No facility-administered medications prior to visit.    Past Medical History:  Diagnosis Date   Hypercholesterolemia    Tuberculosis       Objective:     There were no vitals taken for this visit.         Assessment   No problem-specific Assessment & Plan notes found for this encounter.     Sandrea Hughs, MD 11/21/2023

## 2023-11-22 ENCOUNTER — Institutional Professional Consult (permissible substitution): Payer: Commercial Managed Care - HMO | Admitting: Internal Medicine

## 2023-12-02 ENCOUNTER — Emergency Department (HOSPITAL_COMMUNITY): Payer: Commercial Managed Care - HMO

## 2023-12-02 ENCOUNTER — Inpatient Hospital Stay (HOSPITAL_COMMUNITY): Payer: Commercial Managed Care - HMO

## 2023-12-02 ENCOUNTER — Encounter (HOSPITAL_COMMUNITY): Payer: Self-pay | Admitting: Emergency Medicine

## 2023-12-02 ENCOUNTER — Inpatient Hospital Stay (HOSPITAL_COMMUNITY)
Admission: EM | Admit: 2023-12-02 | Discharge: 2023-12-04 | DRG: 189 | Disposition: A | Discharge status: Home / Self Care | Payer: Commercial Managed Care - HMO | Source: Ambulatory Visit | Attending: Internal Medicine | Admitting: Internal Medicine

## 2023-12-02 ENCOUNTER — Other Ambulatory Visit: Payer: Self-pay

## 2023-12-02 DIAGNOSIS — E7800 Pure hypercholesterolemia, unspecified: Secondary | ICD-10-CM | POA: Diagnosis present

## 2023-12-02 DIAGNOSIS — I252 Old myocardial infarction: Secondary | ICD-10-CM

## 2023-12-02 DIAGNOSIS — Z8249 Family history of ischemic heart disease and other diseases of the circulatory system: Secondary | ICD-10-CM | POA: Diagnosis not present

## 2023-12-02 DIAGNOSIS — Z7951 Long term (current) use of inhaled steroids: Secondary | ICD-10-CM

## 2023-12-02 DIAGNOSIS — R911 Solitary pulmonary nodule: Secondary | ICD-10-CM | POA: Diagnosis present

## 2023-12-02 DIAGNOSIS — R030 Elevated blood-pressure reading, without diagnosis of hypertension: Secondary | ICD-10-CM

## 2023-12-02 DIAGNOSIS — J441 Chronic obstructive pulmonary disease with (acute) exacerbation: Secondary | ICD-10-CM | POA: Diagnosis present

## 2023-12-02 DIAGNOSIS — F1010 Alcohol abuse, uncomplicated: Secondary | ICD-10-CM | POA: Diagnosis present

## 2023-12-02 DIAGNOSIS — Z8611 Personal history of tuberculosis: Secondary | ICD-10-CM | POA: Diagnosis not present

## 2023-12-02 DIAGNOSIS — R5381 Other malaise: Secondary | ICD-10-CM | POA: Diagnosis present

## 2023-12-02 DIAGNOSIS — Z7982 Long term (current) use of aspirin: Secondary | ICD-10-CM

## 2023-12-02 DIAGNOSIS — R0902 Hypoxemia: Secondary | ICD-10-CM | POA: Diagnosis present

## 2023-12-02 DIAGNOSIS — Z79899 Other long term (current) drug therapy: Secondary | ICD-10-CM

## 2023-12-02 DIAGNOSIS — E785 Hyperlipidemia, unspecified: Secondary | ICD-10-CM | POA: Diagnosis not present

## 2023-12-02 DIAGNOSIS — F1210 Cannabis abuse, uncomplicated: Secondary | ICD-10-CM | POA: Diagnosis present

## 2023-12-02 DIAGNOSIS — R531 Weakness: Secondary | ICD-10-CM

## 2023-12-02 DIAGNOSIS — J9601 Acute respiratory failure with hypoxia: Secondary | ICD-10-CM | POA: Diagnosis present

## 2023-12-02 DIAGNOSIS — I10 Essential (primary) hypertension: Secondary | ICD-10-CM | POA: Diagnosis present

## 2023-12-02 DIAGNOSIS — Z833 Family history of diabetes mellitus: Secondary | ICD-10-CM

## 2023-12-02 DIAGNOSIS — J439 Emphysema, unspecified: Secondary | ICD-10-CM | POA: Diagnosis present

## 2023-12-02 DIAGNOSIS — F17210 Nicotine dependence, cigarettes, uncomplicated: Secondary | ICD-10-CM | POA: Diagnosis present

## 2023-12-02 DIAGNOSIS — R634 Abnormal weight loss: Secondary | ICD-10-CM | POA: Diagnosis present

## 2023-12-02 DIAGNOSIS — F17200 Nicotine dependence, unspecified, uncomplicated: Secondary | ICD-10-CM | POA: Diagnosis present

## 2023-12-02 DIAGNOSIS — Z1152 Encounter for screening for COVID-19: Secondary | ICD-10-CM | POA: Diagnosis not present

## 2023-12-02 DIAGNOSIS — Z841 Family history of disorders of kidney and ureter: Secondary | ICD-10-CM | POA: Diagnosis not present

## 2023-12-02 DIAGNOSIS — F1410 Cocaine abuse, uncomplicated: Secondary | ICD-10-CM | POA: Diagnosis present

## 2023-12-02 DIAGNOSIS — Z823 Family history of stroke: Secondary | ICD-10-CM | POA: Diagnosis not present

## 2023-12-02 DIAGNOSIS — Z716 Tobacco abuse counseling: Secondary | ICD-10-CM | POA: Diagnosis not present

## 2023-12-02 LAB — RESP PANEL BY RT-PCR (RSV, FLU A&B, COVID)  RVPGX2
Influenza A by PCR: NEGATIVE
Influenza B by PCR: NEGATIVE
Resp Syncytial Virus by PCR: NEGATIVE
SARS Coronavirus 2 by RT PCR: NEGATIVE

## 2023-12-02 LAB — COMPREHENSIVE METABOLIC PANEL
ALT: 21 U/L (ref 0–44)
AST: 31 U/L (ref 15–41)
Albumin: 4 g/dL (ref 3.5–5.0)
Alkaline Phosphatase: 85 U/L (ref 38–126)
Anion gap: 11 (ref 5–15)
BUN: 11 mg/dL (ref 6–20)
CO2: 32 mmol/L (ref 22–32)
Calcium: 8.9 mg/dL (ref 8.9–10.3)
Chloride: 96 mmol/L — ABNORMAL LOW (ref 98–111)
Creatinine, Ser: 0.77 mg/dL (ref 0.61–1.24)
GFR, Estimated: >60 mL/min (ref >60)
Glucose, Bld: 94 mg/dL (ref 70–99)
Potassium: 4.1 mmol/L (ref 3.5–5.1)
Sodium: 139 mmol/L (ref 135–145)
Total Bilirubin: 1 mg/dL (ref <1.2)
Total Protein: 7.5 g/dL (ref 6.5–8.1)

## 2023-12-02 LAB — CBC
HCT: 45.3 % (ref 39.0–52.0)
Hemoglobin: 15.3 g/dL (ref 13.0–17.0)
MCH: 34 pg (ref 26.0–34.0)
MCHC: 33.8 g/dL (ref 30.0–36.0)
MCV: 100.7 fL — ABNORMAL HIGH (ref 80.0–100.0)
Platelets: 258 10*3/uL (ref 150–400)
RBC: 4.5 MIL/uL (ref 4.22–5.81)
RDW: 12.3 % (ref 11.5–15.5)
WBC: 7.7 10*3/uL (ref 4.0–10.5)
nRBC: 0 % (ref 0.0–0.2)

## 2023-12-02 LAB — RAPID URINE DRUG SCREEN, HOSP PERFORMED
Amphetamines: NOT DETECTED
Barbiturates: NOT DETECTED
Benzodiazepines: NOT DETECTED
Cocaine: POSITIVE — AB
Opiates: NOT DETECTED
Tetrahydrocannabinol: POSITIVE — AB

## 2023-12-02 LAB — TROPONIN I (HIGH SENSITIVITY): Troponin I (High Sensitivity): 7 ng/L (ref <18)

## 2023-12-02 LAB — BRAIN NATRIURETIC PEPTIDE: B Natriuretic Peptide: 121 pg/mL — ABNORMAL HIGH (ref 0.0–100.0)

## 2023-12-02 MED ORDER — ASPIRIN 81 MG PO TBEC
81.0000 mg | DELAYED_RELEASE_TABLET | Freq: Every evening | ORAL | Status: DC
  Administered 2023-12-02 – 2023-12-03 (×2): 81 mg via ORAL
  Filled 2023-12-02 (×2): qty 1

## 2023-12-02 MED ORDER — ADULT MULTIVITAMIN W/MINERALS CH
1.0000 | ORAL_TABLET | Freq: Every day | ORAL | Status: DC
  Administered 2023-12-02 – 2023-12-03 (×2): 1 via ORAL
  Filled 2023-12-02 (×2): qty 1

## 2023-12-02 MED ORDER — GUAIFENESIN ER 600 MG PO TB12
600.0000 mg | ORAL_TABLET | Freq: Two times a day (BID) | ORAL | Status: DC
  Administered 2023-12-02 – 2023-12-04 (×5): 600 mg via ORAL
  Filled 2023-12-02 (×5): qty 1

## 2023-12-02 MED ORDER — ALBUTEROL SULFATE (2.5 MG/3ML) 0.083% IN NEBU
10.0000 mg | INHALATION_SOLUTION | Freq: Once | RESPIRATORY_TRACT | Status: AC
  Administered 2023-12-02: 10 mg via RESPIRATORY_TRACT
  Filled 2023-12-02: qty 12

## 2023-12-02 MED ORDER — OXYCODONE HCL 5 MG PO TABS: 2.5000 mg | ORAL_TABLET | Freq: Four times a day (QID) | ORAL | Status: DC | PRN

## 2023-12-02 MED ORDER — IPRATROPIUM-ALBUTEROL 0.5-2.5 (3) MG/3ML IN SOLN
3.0000 mL | Freq: Three times a day (TID) | RESPIRATORY_TRACT | Status: DC
  Administered 2023-12-03 – 2023-12-04 (×4): 3 mL via RESPIRATORY_TRACT
  Filled 2023-12-02 (×4): qty 3

## 2023-12-02 MED ORDER — ACETAMINOPHEN 650 MG RE SUPP: 650.0000 mg | Freq: Four times a day (QID) | RECTAL | Status: DC | PRN

## 2023-12-02 MED ORDER — ONDANSETRON HCL 4 MG/2ML IJ SOLN: 4.0000 mg | Freq: Four times a day (QID) | INTRAMUSCULAR | Status: DC | PRN

## 2023-12-02 MED ORDER — METHYLPREDNISOLONE SODIUM SUCC 40 MG IJ SOLR
40.0000 mg | Freq: Two times a day (BID) | INTRAMUSCULAR | Status: DC
  Administered 2023-12-02 – 2023-12-04 (×4): 40 mg via INTRAVENOUS
  Filled 2023-12-02 (×4): qty 1

## 2023-12-02 MED ORDER — PANTOPRAZOLE SODIUM 40 MG PO TBEC
40.0000 mg | DELAYED_RELEASE_TABLET | Freq: Every day | ORAL | Status: DC
  Administered 2023-12-02 – 2023-12-03 (×2): 40 mg via ORAL
  Filled 2023-12-02 (×2): qty 1

## 2023-12-02 MED ORDER — BISACODYL 5 MG PO TBEC
5.0000 mg | DELAYED_RELEASE_TABLET | Freq: Every day | ORAL | Status: DC | PRN
Start: 2023-12-02 — End: 2023-12-04

## 2023-12-02 MED ORDER — IPRATROPIUM BROMIDE 0.02 % IN SOLN
0.5000 mg | Freq: Once | RESPIRATORY_TRACT | Status: AC
  Administered 2023-12-02: 0.5 mg via RESPIRATORY_TRACT
  Filled 2023-12-02: qty 2.5

## 2023-12-02 MED ORDER — ALBUTEROL SULFATE (2.5 MG/3ML) 0.083% IN NEBU
5.0000 mg | INHALATION_SOLUTION | Freq: Once | RESPIRATORY_TRACT | Status: AC
  Administered 2023-12-02: 5 mg via RESPIRATORY_TRACT
  Filled 2023-12-02: qty 6

## 2023-12-02 MED ORDER — DOXYCYCLINE HYCLATE 100 MG PO TABS
100.0000 mg | ORAL_TABLET | Freq: Two times a day (BID) | ORAL | Status: DC
  Administered 2023-12-02 – 2023-12-04 (×5): 100 mg via ORAL
  Filled 2023-12-02 (×5): qty 1

## 2023-12-02 MED ORDER — ENOXAPARIN SODIUM 40 MG/0.4ML IJ SOSY
40.0000 mg | PREFILLED_SYRINGE | INTRAMUSCULAR | Status: DC
  Administered 2023-12-02 – 2023-12-03 (×2): 40 mg via SUBCUTANEOUS
  Filled 2023-12-02 (×2): qty 0.4

## 2023-12-02 MED ORDER — ONDANSETRON HCL 4 MG PO TABS: 4.0000 mg | ORAL_TABLET | Freq: Four times a day (QID) | ORAL | Status: DC | PRN

## 2023-12-02 MED ORDER — IPRATROPIUM-ALBUTEROL 0.5-2.5 (3) MG/3ML IN SOLN
3.0000 mL | Freq: Four times a day (QID) | RESPIRATORY_TRACT | Status: DC
  Administered 2023-12-02: 3 mL via RESPIRATORY_TRACT
  Filled 2023-12-02: qty 3

## 2023-12-02 MED ORDER — ADULT MULTIVITAMIN W/MINERALS CH: 1.0000 | ORAL_TABLET | Freq: Every day | ORAL | Status: DC

## 2023-12-02 MED ORDER — ENSURE ENLIVE PO LIQD
237.0000 mL | Freq: Three times a day (TID) | ORAL | Status: DC
Start: 2023-12-02 — End: 2023-12-04
  Administered 2023-12-02 – 2023-12-04 (×6): 237 mL via ORAL
  Filled 2023-12-02 (×4): qty 237

## 2023-12-02 MED ORDER — HYDRALAZINE HCL 20 MG/ML IJ SOLN: 5.0000 mg | INTRAMUSCULAR | Status: DC | PRN

## 2023-12-02 MED ORDER — ACETAMINOPHEN 325 MG PO TABS
650.0000 mg | ORAL_TABLET | Freq: Four times a day (QID) | ORAL | Status: DC | PRN
  Administered 2023-12-02: 650 mg via ORAL
  Filled 2023-12-02: qty 2

## 2023-12-02 MED ORDER — DEXTROMETHORPHAN POLISTIREX ER 30 MG/5ML PO SUER: 30.0000 mg | Freq: Two times a day (BID) | ORAL | Status: DC | PRN

## 2023-12-02 MED ORDER — TRAZODONE HCL 50 MG PO TABS: 25.0000 mg | ORAL_TABLET | Freq: Every evening | ORAL | Status: DC | PRN

## 2023-12-02 NOTE — ED Notes (Signed)
ED TO INPATIENT HANDOFF REPORT  ED Nurse Name and Phone #:   S Name/Age/Gender Adam Mercado 57 y.o. male Room/Bed: APA08/APA08  Code Status   Code Status: Full Code  Home/SNF/Other Home Patient oriented to: self, place, time, and situation Is this baseline? Yes   Triage Complete: Triage complete  Chief Complaint Acute respiratory failure with hypoxia (HCC) [J96.01]  Triage Note Pt reports SHOB and cough x 1 month. Pt reports the [facility] has been worse over the last 2-3 days. Pt 78% on RA upon arrival. Pt on 6L at this time with O2 97%.   Allergies No Known Allergies  Level of Care/Admitting Diagnosis ED Disposition     ED Disposition  Admit   Condition  --   Comment  Hospital Area: [facility] [[id number]]  Level of Care: Med-Surg [16]  Covid Evaluation: Asymptomatic - no recent exposure (last 10 days) testing not required  Diagnosis: Acute respiratory failure with hypoxia [facility]) [[id number]]  Admitting Physician: Cleora Fleet [[id number]]  Attending Physician: Cleora Fleet [[id number]]  Certification:: I certify this patient will need inpatient services for at least 2 midnights  Expected Medical Readiness: 12/05/2023          B Medical/Surgery History Past Medical History:  Diagnosis Date   Hypercholesterolemia    Tuberculosis    Past Surgical History:  Procedure Laterality Date   CARDIAC CATHETERIZATION N/A 08/24/2016   Procedure: Left Heart Cath and Coronary Angiography;  Surgeon: Peter M Swaziland, MD;  Location: [facility] INVASIVE CV LAB;  Service: Cardiovascular;  Laterality: N/A;   FINGER SURGERY       A IV Location/Drains/Wounds Patient Lines/Drains/Airways Status     Active Line/Drains/Airways     Name Placement date Placement time Site Days   Peripheral IV 12/02/23 20 G 1" Anterior;Right Forearm 12/02/23  1122  Forearm  less than 1            Intake/Output Last 24 hours  Intake/Output Summary (Last 24 hours) at 12/02/2023 2004 Last data  filed at 12/02/2023 1731 Gross per 24 hour  Intake --  Output 200 ml  Net -200 ml    Labs/Imaging Results for orders placed or performed during the hospital encounter of 12/02/23 (from the past 48 hours)  CBC     Status: Abnormal   Collection Time: 12/02/23 11:15 AM  Result Value Ref Range   WBC 7.7 4.0 - 10.5 K/uL   RBC 4.50 4.22 - 5.81 MIL/uL   Hemoglobin 15.3 13.0 - 17.0 g/dL   HCT 21.3 08.6 - 57.8 %   MCV 100.7 (H) 80.0 - 100.0 fL   MCH 34.0 26.0 - 34.0 pg   MCHC 33.8 30.0 - 36.0 g/dL   RDW 46.9 62.9 - 52.8 %   Platelets 258 150 - 400 K/uL   nRBC 0.0 0.0 - 0.2 %    Comment: Performed at [facility], [address], [place], [place] [address]  Comprehensive metabolic panel     Status: Abnormal   Collection Time: 12/02/23 11:15 AM  Result Value Ref Range   Sodium 139 135 - 145 mmol/L   Potassium 4.1 3.5 - 5.1 mmol/L   Chloride 96 (L) 98 - 111 mmol/L   CO2 32 22 - 32 mmol/L   Glucose, Bld 94 70 - 99 mg/dL    Comment: Glucose reference range applies only to samples taken after fasting for at least 8 hours.   BUN 11 6 - 20 mg/dL   Creatinine, Ser 4.01 0.61 -  1.24 mg/dL   Calcium 8.9 8.9 - 16.1 mg/dL   Total Protein 7.5 6.5 - 8.1 g/dL   Albumin 4.0 3.5 - 5.0 g/dL   AST 31 15 - 41 U/L   ALT 21 0 - 44 U/L   Alkaline Phosphatase 85 38 - 126 U/L   Total Bilirubin 1.0 <1.2 mg/dL   GFR, Estimated >09 >60 mL/min    Comment: (NOTE) Calculated using the CKD-EPI Creatinine Equation (2021)    Anion gap 11 5 - 15    Comment: Performed at [facility], [address], [place], [place] [address]  Brain natriuretic peptide     Status: Abnormal   Collection Time: 12/02/23 11:15 AM  Result Value Ref Range   B Natriuretic Peptide 121.0 (H) 0.0 - 100.0 pg/mL    Comment: Performed at [facility], [address] [place], [place] [address]  Troponin I (High Sensitivity)     Status: None   Collection Time: 12/02/23 11:15 AM  Result Value Ref Range   Troponin I (High Sensitivity) 7 <18  ng/L    Comment: (NOTE) Elevated high sensitivity troponin I (hsTnI) values and significant  changes across serial measurements may suggest ACS but many other  chronic and acute conditions are known to elevate hsTnI results.  Refer to the "Links" section for chest pain algorithms and additional  guidance. Performed at [facility], [address], [place], [place] [address]   Resp panel by RT-PCR (RSV, Flu A&B, Covid) Anterior Nasal Swab     Status: None   Collection Time: 12/02/23 11:15 AM   Specimen: Anterior Nasal Swab  Result Value Ref Range   SARS Coronavirus 2 by RT PCR NEGATIVE NEGATIVE    Comment: (NOTE) SARS-CoV-2 target nucleic acids are NOT DETECTED.  The SARS-CoV-2 RNA is generally detectable in upper respiratory specimens during the acute phase of infection. The lowest concentration of SARS-CoV-2 viral copies this assay can detect is 138 copies/mL. A negative result does not preclude SARS-Cov-2 infection and should not be used as the sole basis for treatment or other patient management decisions. A negative result may occur with  improper specimen collection/handling, submission of specimen other than nasopharyngeal swab, presence of viral mutation(s) within the areas targeted by this assay, and inadequate number of viral copies(<138 copies/mL). A negative result must be combined with clinical observations, patient history, and epidemiological information. The expected result is Negative.  Fact Sheet for Patients:  BloggerCourse.com  Fact Sheet for Healthcare Providers:  SeriousBroker.it  This test is no t yet approved or cleared by the [place] FDA and  has been authorized for detection and/or diagnosis of SARS-CoV-2 by FDA under an Emergency Use Authorization (EUA). This EUA will remain  in effect (meaning this test can be used) for the duration of the COVID-19 declaration under Section 564(b)(1) of the Act,  21 U.S.C.section 360bbb-3(b)(1), unless the authorization is terminated  or revoked sooner.       Influenza A by PCR NEGATIVE NEGATIVE   Influenza B by PCR NEGATIVE NEGATIVE    Comment: (NOTE) The Xpert Xpress SARS-CoV-2/FLU/RSV plus assay is intended as an aid in the diagnosis of influenza from Nasopharyngeal swab specimens and should not be used as a sole basis for treatment. Nasal washings and aspirates are unacceptable for Xpert Xpress SARS-CoV-2/FLU/RSV testing.  Fact Sheet for Patients: BloggerCourse.com  Fact Sheet for Healthcare Providers: SeriousBroker.it  This test is not yet approved or cleared by the [place] FDA and has been authorized for detection  and/or diagnosis of SARS-CoV-2 by FDA under an Emergency Use Authorization (EUA). This EUA will remain in effect (meaning this test can be used) for the duration of the COVID-19 declaration under Section 564(b)(1) of the Act, 21 U.S.C. section 360bbb-3(b)(1), unless the authorization is terminated or revoked.     Resp Syncytial Virus by PCR NEGATIVE NEGATIVE    Comment: (NOTE) Fact Sheet for Patients: BloggerCourse.com  Fact Sheet for Healthcare Providers: SeriousBroker.it  This test is not yet approved or cleared by the [place] FDA and has been authorized for detection and/or diagnosis of SARS-CoV-2 by FDA under an Emergency Use Authorization (EUA). This EUA will remain in effect (meaning this test can be used) for the duration of the COVID-19 declaration under Section 564(b)(1) of the Act, 21 U.S.C. section 360bbb-3(b)(1), unless the authorization is terminated or revoked.  Performed at [facility], [address], [place], [place] [address]   Rapid urine drug screen (hospital performed)     Status: Abnormal   Collection Time: 12/02/23  5:30 PM  Result Value Ref Range   Opiates NONE DETECTED NONE  DETECTED   Cocaine POSITIVE (A) NONE DETECTED   Benzodiazepines NONE DETECTED NONE DETECTED   Amphetamines NONE DETECTED NONE DETECTED   Tetrahydrocannabinol POSITIVE (A) NONE DETECTED   Barbiturates NONE DETECTED NONE DETECTED    Comment: (NOTE) DRUG SCREEN FOR MEDICAL PURPOSES ONLY.  IF CONFIRMATION IS NEEDED FOR ANY PURPOSE, NOTIFY LAB WITHIN 5 DAYS.  LOWEST DETECTABLE LIMITS FOR URINE DRUG SCREEN Drug Class                     Cutoff (ng/mL) Amphetamine and metabolites    1000 Barbiturate and metabolites    200 Benzodiazepine                 200 Opiates and metabolites        300 Cocaine and metabolites        300 THC                            50 Performed at [facility], [address] [place], [place] [address]    CT CHEST WO CONTRAST Result Date: 12/02/2023 CLINICAL DATA:  Shortness of breath and cough. EXAM: CT CHEST WITHOUT CONTRAST TECHNIQUE: Multidetector CT imaging of the chest was performed following the standard protocol without IV contrast. RADIATION DOSE REDUCTION: This exam was performed according to the departmental dose-optimization program which includes automated exposure control, adjustment of the mA and/or kV according to patient size and/or use of iterative reconstruction technique. COMPARISON:  None Available. FINDINGS: Cardiovascular: Normal cardiac size. No pericardial effusion. No aortic aneurysm. There are coronary artery calcifications, in keeping with coronary artery disease. There are also mild peripheral atherosclerotic vascular calcifications of thoracic aorta and its major branches. Mediastinum/Nodes: Visualized thyroid gland appears grossly unremarkable. No solid / cystic mediastinal masses. The esophagus is nondistended precluding optimal assessment. No mediastinal or axillary lymphadenopathy by size criteria. Evaluation of bilateral hila is limited due to lack on intravenous contrast: however, no large hilar lymphadenopathy identified. Lungs/Pleura:  The central tracheo-bronchial tree is patent. There are diffuse moderate centrilobular emphysematous changes throughout bilateral lungs. No mass or consolidation. No pleural effusion or pneumothorax. No suspicious lung nodules. Upper Abdomen: Visualized upper abdominal viscera within normal limits. Musculoskeletal: The visualized soft tissues of the chest wall are grossly unremarkable. No suspicious osseous lesions. There are mild multilevel degenerative changes in the  visualized spine. IMPRESSION: *Diffuse moderate centrilobular emphysema. *No lung mass, consolidation, pleural effusion or pneumothorax. No suspicious lung nodule. *Multiple other nonacute observations, as described above. Aortic Atherosclerosis (ICD10-I70.0) and Emphysema (ICD10-J43.9). Electronically Signed   By: Jules Schick M.D.   On: 12/02/2023 16:11   DG Chest Port 1 View Result Date: 12/02/2023 CLINICAL DATA:  Cough, SOB. EXAM: PORTABLE CHEST - 1 VIEW COMPARISON:  04/21/2022. FINDINGS: The heart size and mediastinal contours are within normal limits. Lungs are hyperinflated suggesting COPD. There is no focal consolidation. Upper right-sided cm sized nodule appears to be a new finding that can be evaluated further with noncontrast chest CT. No pneumothorax or pleural effusion. The visualized skeletal structures are unremarkable. IMPRESSION: 1. COPD. No acute cardiopulmonary process. 2. Right upper lobe nodule. Recommend further evaluation with noncontrast chest CT. Electronically Signed   By: Layla Maw M.D.   On: 12/02/2023 12:56    Pending Labs Unresulted Labs (From admission, onward)     Start     Ordered   12/09/23 0500  Creatinine, serum  (enoxaparin (LOVENOX)    CrCl >/= 30 ml/min)  Weekly,   R     Comments: while on enoxaparin therapy    12/02/23 1400   12/03/23 0500  HIV Antibody (routine testing w rflx)  Once,   R        12/03/23 0500            Vitals/Pain Today's Vitals   12/02/23 1744 12/02/23 1800  12/02/23 1900 12/02/23 2000  BP:  (!) 154/87 112/68 127/60  Pulse:  79 76 71  Resp:   (!) 29 (!) 21  Temp:      TempSrc:      SpO2:  98% 95% 94%  PainSc: 0-No pain       Isolation Precautions No active isolations  Medications Medications  enoxaparin (LOVENOX) injection 40 mg (has no administration in time range)  acetaminophen (TYLENOL) tablet 650 mg (has no administration in time range)    Or  acetaminophen (TYLENOL) suppository 650 mg (has no administration in time range)  oxyCODONE (Oxy IR/ROXICODONE) immediate release tablet 2.5-5 mg (has no administration in time range)  traZODone (DESYREL) tablet 25 mg (has no administration in time range)  bisacodyl (DULCOLAX) EC tablet 5 mg (has no administration in time range)  ondansetron (ZOFRAN) tablet 4 mg (has no administration in time range)    Or  ondansetron (ZOFRAN) injection 4 mg (has no administration in time range)  guaiFENesin (MUCINEX) 12 hr tablet 600 mg (600 mg Oral Given 12/02/23 1419)  ipratropium-albuterol (DUONEB) 0.5-2.5 (3) MG/3ML nebulizer solution 3 mL (has no administration in time range)  doxycycline (VIBRA-TABS) tablet 100 mg (100 mg Oral Given 12/02/23 1419)  dextromethorphan (DELSYM) 30 MG/5ML liquid 30 mg (has no administration in time range)  pantoprazole (PROTONIX) EC tablet 40 mg (has no administration in time range)  hydrALAZINE (APRESOLINE) injection 5 mg (has no administration in time range)  methylPREDNISolone sodium succinate (SOLU-MEDROL) 40 mg/mL injection 40 mg (has no administration in time range)  aspirin EC tablet 81 mg (81 mg Oral Given 12/02/23 1740)  feeding supplement (ENSURE ENLIVE / ENSURE PLUS) liquid 237 mL (237 mLs Oral Given 12/02/23 1536)  multivitamin with minerals tablet 1 tablet (1 tablet Oral Given 12/02/23 1740)  ipratropium (ATROVENT) nebulizer solution 0.5 mg (0.5 mg Nebulization Given 12/02/23 1136)  albuterol (PROVENTIL) (2.5 MG/3ML) 0.083% nebulizer solution 10 mg (10 mg  Nebulization Given 12/02/23 1135)  albuterol (PROVENTIL) (2.5 MG/3ML)  0.083% nebulizer solution 5 mg (5 mg Nebulization Given 12/02/23 1420)  ipratropium (ATROVENT) nebulizer solution 0.5 mg (0.5 mg Nebulization Given 12/02/23 1420)    Mobility walks     Focused Assessments    R Recommendations: See Admitting Provider Note  Report given to:   Additional Notes:

## 2023-12-02 NOTE — ED Notes (Signed)
Patient transported to CT 

## 2023-12-02 NOTE — Hospital Course (Addendum)
57 year old male lifelong smoker with history of marijuana and cocaine use, NSTEMI, COPD, history of tuberculosis, hyperlipidemia history of alcohol use but initially denies current use who reportedly was seen at the local urgent care complaining of 1 month of cough with progressive wheezing and shortness of breath symptoms.  His cough has been largelt nonproductive and he denies hemoptysis.  He denies sick contacts.  He has had subjective fevers and chills.  He does report malaise and fatigue.  He was hypoxic at the urgent care with a pulse ox in the mid 80s on room air.  He received a dose of steroid at the urgent care and was sent to the emergency department.  In the ED, the patient had low-grade temperature 99.0 F.  He was hemodynamically stable.  Oxygen saturation was 85% on room air.  He received further treatments with bronchodilators and supplemental oxygen but continues to cough and wheeze and desaturate with minimal activity.  Hospital admission was requested for further management of acute respiratory failure with hypoxia secondary to COPD exacerbation.

## 2023-12-02 NOTE — ED Provider Notes (Signed)
[place] [facility]MENT AT [facility] Provider Note   CSN: [id number] Arrival date & time: 12/02/23  1038     History  Chief Complaint  Patient presents with   Shortness of Breath    Adam Mercado is a 57 y.o. male.  Pt with hx copd, smoking, c/o cough x 1 month and increased wheezing/sob. Cough generally non productive, occasionally small amount of phlegm, no hemoptysis. No sore throat. No specific known ill contacts or known covid/flu exposure. No fever/chills. Chest has felt tight in past day, at rest, constant. No episodic or exertional chest pain. No pleuritic pain. No leg pain or swelling. Went to urgent care and o2 sats low, so sent to ED.   The history is provided by the patient, a relative and medical records.  Shortness of Breath Associated symptoms: cough and wheezing   Associated symptoms: no abdominal pain, no fever, no headaches, no neck pain, no rash, no sore throat and no vomiting        Home Medications Prior to Admission medications   Medication Sig Start Date End Date Taking? Authorizing Provider  albuterol (PROVENTIL HFA) 108 (90 Base) MCG/ACT inhaler INHALE 2 PUFFS BY MOUTH EVERY 6 HOURS AS NEEDED FOR COUGHING, WHEEZING, OR SHORTNESS OF BREATH 07/30/20   Jacquelin Hawking, PA-C  aspirin EC 81 MG EC tablet Take 1 tablet (81 mg total) by mouth daily. 08/25/16   Leroy Sea, MD  atorvastatin (LIPITOR) 20 MG tablet TAKE 1 Tablet BY MOUTH ONCE EVERY DAY 07/30/20   Jacquelin Hawking, PA-C  Fluticasone-Salmeterol (ADVAIR DISKUS) 100-50 MCG/DOSE AEPB Inhale 1 puff into the lungs in the morning and at bedtime. 07/30/20   Jacquelin Hawking, PA-C      Allergies    Patient has no known allergies.    Review of Systems   Review of Systems  Constitutional:  Negative for fever.  HENT:  Negative for sore throat.   Eyes:  Negative for redness.  Respiratory:  Positive for cough, shortness of breath and wheezing.   Cardiovascular:  Negative for  palpitations and leg swelling.  Gastrointestinal:  Negative for abdominal pain and vomiting.  Genitourinary:  Negative for flank pain.  Musculoskeletal:  Negative for neck pain and neck stiffness.  Skin:  Negative for rash.  Neurological:  Negative for headaches.  Psychiatric/Behavioral:  Negative for confusion.     Physical Exam Updated Vital Signs BP (!) 196/93   Pulse 75   Temp 98 F (36.7 C) (Oral)   Resp (!) 24   SpO2 99%  Physical Exam Vitals and nursing note reviewed.  Constitutional:      Appearance: Normal appearance. He is well-developed.  HENT:     Head: Atraumatic.     Nose: Nose normal.     Mouth/Throat:     Mouth: Mucous membranes are moist.     Pharynx: Oropharynx is clear. No oropharyngeal exudate or posterior oropharyngeal erythema.  Eyes:     General: No scleral icterus.    Conjunctiva/sclera: Conjunctivae normal.  Neck:     Trachea: No tracheal deviation.  Cardiovascular:     Rate and Rhythm: Normal rate and regular rhythm.     Pulses: Normal pulses.     Heart sounds: Normal heart sounds. No murmur heard.    No friction rub. No gallop.  Pulmonary:     Effort: Respiratory distress present. No accessory muscle usage.     Breath sounds: Wheezing present.  Abdominal:     General: There is no  distension.     Palpations: Abdomen is soft.     Tenderness: There is no abdominal tenderness.  Musculoskeletal:        General: No swelling or tenderness.     Cervical back: Normal range of motion and neck supple. No rigidity.     Right lower leg: No edema.     Left lower leg: No edema.  Skin:    General: Skin is warm and dry.     Findings: No rash.  Neurological:     Mental Status: He is alert.     Comments: Alert, speech clear. Motor/sens grossly intact bil.   Psychiatric:        Mood and Affect: Mood normal.     ED Results / Procedures / Treatments   Labs (all labs ordered are listed, but only abnormal results are displayed) Results for orders  placed or performed during the hospital encounter of 12/02/23  Resp panel by RT-PCR (RSV, Flu A&B, Covid) Anterior Nasal Swab   Collection Time: 12/02/23 11:15 AM   Specimen: Anterior Nasal Swab  Result Value Ref Range   SARS Coronavirus 2 by RT PCR NEGATIVE NEGATIVE   Influenza A by PCR NEGATIVE NEGATIVE   Influenza B by PCR NEGATIVE NEGATIVE   Resp Syncytial Virus by PCR NEGATIVE NEGATIVE  CBC   Collection Time: 12/02/23 11:15 AM  Result Value Ref Range   WBC 7.7 4.0 - 10.5 K/uL   RBC 4.50 4.22 - 5.81 MIL/uL   Hemoglobin 15.3 13.0 - 17.0 g/dL   HCT 29.5 28.4 - 13.2 %   MCV 100.7 (H) 80.0 - 100.0 fL   MCH 34.0 26.0 - 34.0 pg   MCHC 33.8 30.0 - 36.0 g/dL   RDW 44.0 10.2 - 72.5 %   Platelets 258 150 - 400 K/uL   nRBC 0.0 0.0 - 0.2 %  Comprehensive metabolic panel   Collection Time: 12/02/23 11:15 AM  Result Value Ref Range   Sodium 139 135 - 145 mmol/L   Potassium 4.1 3.5 - 5.1 mmol/L   Chloride 96 (L) 98 - 111 mmol/L   CO2 32 22 - 32 mmol/L   Glucose, Bld 94 70 - 99 mg/dL   BUN 11 6 - 20 mg/dL   Creatinine, Ser 3.66 0.61 - 1.24 mg/dL   Calcium 8.9 8.9 - 44.0 mg/dL   Total Protein 7.5 6.5 - 8.1 g/dL   Albumin 4.0 3.5 - 5.0 g/dL   AST 31 15 - 41 U/L   ALT 21 0 - 44 U/L   Alkaline Phosphatase 85 38 - 126 U/L   Total Bilirubin 1.0 <1.2 mg/dL   GFR, Estimated >34 >74 mL/min   Anion gap 11 5 - 15  Brain natriuretic peptide   Collection Time: 12/02/23 11:15 AM  Result Value Ref Range   B Natriuretic Peptide 121.0 (H) 0.0 - 100.0 pg/mL  Troponin I (High Sensitivity)   Collection Time: 12/02/23 11:15 AM  Result Value Ref Range   Troponin I (High Sensitivity) 7 <18 ng/L     EKG EKG Interpretation Date/Time:  Thursday December 02 2023 11:08:57 EST Ventricular Rate:  82 PR Interval:  114 QRS Duration:  100 QT Interval:  422 QTC Calculation: 493 R Axis:   79  Text Interpretation: Sinus rhythm Left ventricular hypertrophy Non-specific ST-t changes Baseline wander No  significant change since last tracing Confirmed by Cathren Laine ([id number]) on 12/02/2023 12:29:34 PM  Radiology DG Chest Port 1 View Result Date: 12/02/2023 CLINICAL DATA:  Cough, SOB. EXAM: PORTABLE CHEST - 1 VIEW COMPARISON:  04/21/2022. FINDINGS: The heart size and mediastinal contours are within normal limits. Lungs are hyperinflated suggesting COPD. There is no focal consolidation. Upper right-sided cm sized nodule appears to be a new finding that can be evaluated further with noncontrast chest CT. No pneumothorax or pleural effusion. The visualized skeletal structures are unremarkable. IMPRESSION: 1. COPD. No acute cardiopulmonary process. 2. Right upper lobe nodule. Recommend further evaluation with noncontrast chest CT. Electronically Signed   By: Layla Maw M.D.   On: 12/02/2023 12:56    Procedures Procedures    Medications Ordered in ED Medications  albuterol (PROVENTIL) (2.5 MG/3ML) 0.083% nebulizer solution 5 mg (has no administration in time range)  ipratropium (ATROVENT) nebulizer solution 0.5 mg (has no administration in time range)  ipratropium (ATROVENT) nebulizer solution 0.5 mg (0.5 mg Nebulization Given 12/02/23 1136)  albuterol (PROVENTIL) (2.5 MG/3ML) 0.083% nebulizer solution 10 mg (10 mg Nebulization Given 12/02/23 1135)    ED Course/ Medical Decision Making/ A&P                                 Medical Decision Making Problems Addressed: Acute respiratory failure with hypoxia [facility]): acute illness or injury with systemic symptoms that poses a threat to life or bodily functions COPD exacerbation (HCC): acute illness or injury with systemic symptoms that poses a threat to life or bodily functions Elevated blood pressure reading: acute illness or injury Nodule of upper lobe of right lung: acute illness or injury    Details: Acute/chronic Tobacco use disorder: chronic illness or injury with exacerbation, progression, or side effects of treatment that poses a  threat to life or bodily functions  Amount and/or Complexity of Data Reviewed Independent Historian:     Details: Family, hx External Data Reviewed: notes. Labs: ordered. Decision-making details documented in ED Course. Radiology: ordered and independent interpretation performed. Decision-making details documented in ED Course. ECG/medicine tests: ordered and independent interpretation performed. Decision-making details documented in ED Course. Discussion of management or test interpretation with external provider(s): medicine  Risk Prescription drug management. Decision regarding hospitalization.   Iv ns. Continuous pulse ox and cardiac monitoring. Labs ordered/sent. Imaging ordered.   Differential diagnosis includes pna, copd exacerbation, covid, etc. Dispo decision including potential need for admission considered - will get labs and imaging and reassess.   Reviewed nursing notes and prior charts for additional history. External reports reviewed. Additional history from: family.   Cardiac monitor: sinus rhythm, rate 90.  Pt reports having just received steroid shot at urgent care.   Albuterol continuous neb. Atrovent neb.  Labs reviewed/interpreted by me - wbc and hgb normal. Chem normal. Covid and flu neg. Trop and bnp normal.   Xrays reviewed/interpreted by me - no pna. Right lung nodule - pt to need f/u upon eventual hospital d/c.    CRITICAL CARE RE: acute respiratory failure with hypoxia, copd exacerbation, continuous albuterol nebulizer treatments, right upper lung nodule.  Performed by: Suzi Roots Total critical care time: 45 minutes Critical care time was exclusive of separately billable procedures and treating other patients. Critical care was necessary to treat or prevent imminent or life-threatening deterioration. Critical care was time spent personally by me on the following activities: development of treatment plan with patient and/or surrogate as well as  nursing, discussions with consultants, evaluation of patient's response to treatment, examination of patient, obtaining history from patient or surrogate,  ordering and performing treatments and interventions, ordering and review of laboratory studies, ordering and review of radiographic studies, pulse oximetry and re-evaluation of patient's condition.         Final Clinical Impression(s) / ED Diagnoses Final diagnoses:  None    Rx / DC Orders ED Discharge Orders     None         Cathren Laine, MD 12/02/23 1337

## 2023-12-02 NOTE — ED Triage Notes (Signed)
Pt reports SHOB and cough x 1 month. Pt reports the [facility] has been worse over the last 2-3 days. Pt 78% on RA upon arrival. Pt on 6L at this time with O2 97%.

## 2023-12-02 NOTE — H&P (Addendum)
History and Physical  [facility]  Adam Mercado DOB: 26-Jan-1966 DOA: 12/02/2023  PCP: Kara Pacer, NP  Patient coming from: sent from [facility]  Level of care: Med-Surg  I have personally briefly reviewed patient's old medical records in [facility]  Chief Complaint: SOB   HPI: Adam Mercado is a 57 year old male lifelong smoker with history of marijuana and cocaine use, history of NSTEMI, COPD, abnormal weight loss, history of tuberculosis, hyperlipidemia history of alcohol use but denies current use who reportedly was seen at the local urgent care complaining of 1 month of cough with progressive wheezing and shortness of breath symptoms.  His cough has been nonproductive and he denies hemoptysis.  He denies sick contacts.  He does report malaise and fatigue.  He was hypoxic at the urgent care with a pulse ox in the mid 80s on room air.  He received a dose of steroid at the urgent care and was sent to the emergency department.  In the ED he received further treatments with bronchodilators and supplemental oxygen but continues to cough and wheeze and desaturate with minimal activity.  Hospital admission was requested for further management of acute respiratory failure with hypoxia secondary to COPD exacerbation.    Past Medical History:  Diagnosis Date   Hypercholesterolemia    Tuberculosis     Past Surgical History:  Procedure Laterality Date   CARDIAC CATHETERIZATION N/A 08/24/2016   Procedure: Left Heart Cath and Coronary Angiography;  Surgeon: Peter M Swaziland, MD;  Location[facility] INVASIVE CV LAB;  Service: Cardiovascular;  Laterality: N/A;   FINGER SURGERY       reports that he has been smoking cigarettes. He has a 15 pack-year smoking history. He has never used smokeless tobacco. He reports that he does not currently use alcohol. He reports current drug use. Drugs: Cocaine and Marijuana.  No Known Allergies  Family  History  Problem Relation Age of Onset   Hypertension Mother    Kidney disease Mother    Hypertension Father    Stroke Father    Stroke Sister    Diabetes Brother    Colon cancer Neg Hx    Colon polyps Neg Hx     Prior to Admission medications   Medication Sig Start Date End Date Taking? Authorizing Provider  albuterol (PROVENTIL HFA) 108 (90 Base) MCG/ACT inhaler INHALE 2 PUFFS BY MOUTH EVERY 6 HOURS AS NEEDED FOR COUGHING, WHEEZING, OR SHORTNESS OF BREATH 07/30/20  Yes Jacquelin Hawking, PA-C  aspirin EC 81 MG EC tablet Take 1 tablet (81 mg total) by mouth daily. 08/25/16  Yes Leroy Sea, MD  atorvastatin (LIPITOR) 20 MG tablet TAKE 1 Tablet BY MOUTH ONCE EVERY DAY 07/30/20   Jacquelin Hawking, PA-C  Fluticasone-Salmeterol (ADVAIR DISKUS) 100-50 MCG/DOSE AEPB Inhale 1 puff into the lungs in the morning and at bedtime. 07/30/20   Jacquelin Hawking, PA-C    Physical Exam: Vitals:   12/02/23 1106 12/02/23 1125 12/02/23 1136 12/02/23 1145  BP: (!) 184/87   (!) 196/93  Pulse: 79 76  75  Resp: (!) 27 (!) 23  (!) 24  Temp: 98 F (36.7 C)     TempSrc: Oral     SpO2: 98% 95% 93% 99%    Constitutional: cachectic appearing male, appears older than stated age; he is awake, moderately distress; tachypneic on supplemental oxygen via nasal cannula. He appears Uncomfortable Eyes: PERRL, lids and conjunctivae normal ENMT: Mucous membranes are pale but  moist. Posterior pharynx clear of any exudate or lesions. Poor dentition.  Neck: normal, supple, no masses, no thyromegaly Respiratory: diffuse bilateral expiratory wheezing, no crackles. Moderate tachypnea and moderate increased work of breathing. Speaking in shortened sentences.   Cardiovascular: normal s1, s2 sounds, no murmurs / rubs / gallops. No extremity edema. 2+ pedal pulses. No carotid bruits.  Abdomen: no tenderness, no masses palpated. No hepatosplenomegaly. Bowel sounds positive.  Musculoskeletal: no clubbing / cyanosis. No joint  deformity upper and lower extremities. Good ROM, no contractures. Normal muscle tone.  Skin: no rashes, lesions, ulcers. No induration Neurologic: CN 2-12 grossly intact. Sensation intact, DTR normal. Strength 5/5 in all 4.  Psychiatric: poor judgment and insight. Alert and oriented x 3. Normal mood.   Labs on Admission: I have personally reviewed following labs and imaging studies  CBC: Recent Labs  Lab 12/02/23 1115  WBC 7.7  HGB 15.3  HCT 45.3  MCV 100.7*  PLT 258   Basic Metabolic Panel: Recent Labs  Lab 12/02/23 1115  NA 139  K 4.1  CL 96*  CO2 32  GLUCOSE 94  BUN 11  CREATININE 0.77  CALCIUM 8.9   GFR: CrCl cannot be calculated (Unknown ideal weight.). Liver Function Tests: Recent Labs  Lab 12/02/23 1115  AST 31  ALT 21  ALKPHOS 85  BILITOT 1.0  PROT 7.5  ALBUMIN 4.0   No results for input(s): "LIPASE", "AMYLASE" in the last 168 hours. No results for input(s): "AMMONIA" in the last 168 hours. Coagulation Profile: No results for input(s): "INR", "PROTIME" in the last 168 hours. Cardiac Enzymes: No results for input(s): "CKTOTAL", "CKMB", "CKMBINDEX", "TROPONINI" in the last 168 hours. BNP (last 3 results) No results for input(s): "PROBNP" in the last 8760 hours. HbA1C: No results for input(s): "HGBA1C" in the last 72 hours. CBG: No results for input(s): "GLUCAP" in the last 168 hours. Lipid Profile: No results for input(s): "CHOL", "HDL", "LDLCALC", "TRIG", "CHOLHDL", "LDLDIRECT" in the last 72 hours. Thyroid Function Tests: No results for input(s): "TSH", "T4TOTAL", "FREET4", "T3FREE", "THYROIDAB" in the last 72 hours. Anemia Panel: No results for input(s): "VITAMINB12", "FOLATE", "FERRITIN", "TIBC", "IRON", "RETICCTPCT" in the last 72 hours. Urine analysis: No results found for: "COLORURINE", "APPEARANCEUR", "LABSPEC", "PHURINE", "GLUCOSEU", "HGBUR", "BILIRUBINUR", "KETONESUR", "PROTEINUR", "UROBILINOGEN", "NITRITE", "LEUKOCYTESUR"  Radiological  Exams on Admission: DG Chest Port 1 View Result Date: 12/02/2023 CLINICAL DATA:  Cough, SOB. EXAM: PORTABLE CHEST - 1 VIEW COMPARISON:  04/21/2022. FINDINGS: The heart size and mediastinal contours are within normal limits. Lungs are hyperinflated suggesting COPD. There is no focal consolidation. Upper right-sided cm sized nodule appears to be a new finding that can be evaluated further with noncontrast chest CT. No pneumothorax or pleural effusion. The visualized skeletal structures are unremarkable. IMPRESSION: 1. COPD. No acute cardiopulmonary process. 2. Right upper lobe nodule. Recommend further evaluation with noncontrast chest CT. Electronically Signed   By: Layla Maw M.D.   On: 12/02/2023 12:56   EKG: Independently reviewed.  Assessment/Plan Principal Problem:   Acute respiratory failure with hypoxia (HCC) Active Problems:   Oxygen desaturation   Cocaine abuse (HCC)   Marijuana abuse   Smoker   Generalized weakness   Nodule of upper lobe of right lung   Essential hypertension   Hyperlipidemia   Acute respiratory failure with hypoxia  -secondary to acute COPD exacerbation  -failed outpatient management at local urgent care and ED -admit for IV solumedrol around the clock -schedule bronchodilators -mucinex 600 mg BID -doxycycline 100 mg  BID -delsym cough syrup  -wean supplemental oxygen to room air as able  -flutter valve therapy -RT consult as he may need chest physiotherapy  Generalized weakness and debility  - multifactorial given acute respiratory decompensation  - supportive care ordered - reassess after treatments  Hyperlipidemia - he reports no longer taking statin, possibly due to weight loss  Essential hypertension  - he reports he does not take BP lowering medications - IV medication ordered (hydralazine) PRN - follow trends and adjust therapy as needed  Polysubstance abuse Recreational substance use History of alcohol use -denies current use of  alcohol -follow up urine toxicology screen  Tobacco dependence -nicotine patch ordered -pt was not in the mood to receive my counseling against tobacco use at this time  Nodule of right upper lung -given lifelong smoking history and chronic cough for 1 month and weight loss -CT chest without contrast ordered to be done in AM  -ambulatory referral to pulmonary nodule clinic at discharge  Abnormal Weight loss -consult to dietitian for nutritional education and recommendations   Time spent 58 mins  DVT prophylaxis: enoxaparin   Code Status: Full   Family Communication:   Disposition Plan: anticipate home   Consults called:   Admission status: INP  Level of care: Med-Surg Standley Dakins MD [facility] How to contact the [facility] Attending or Consulting provider 7A - 7P or covering provider during after hours 7P -7A, for this patient?  Check the care team in [facility] and look for a) attending/consulting [facility] provider listed and b) the [facility] team listed Log into [email or web address] and use [facility] universal password to access. If you do not have the password, please contact the hospital operator. Locate the [facility] provider you are looking for under [facility] and page to a number that you can be directly reached. If you still have difficulty reaching the provider, please page the [facility] (Director on Call) for the Hospitalists listed on amion for assistance.   If 7PM-7AM, please contact night-coverage [email or web address] Password [email or web address]  12/02/2023, 2:00 PM

## 2023-12-02 NOTE — Discharge Instructions (Addendum)
Your chest xray shows a right upper lung mass/nodule - you must follow up with primary care doctor in the coming month - discuss having outpatient CT scan to further evaluate, as it is very important to make sure that the nodule/mass does not represent a lung cancer.   High-Calorie, High-Protein Nutrition Therapy (2021) A high-calorie, high-protein diet has been recommended to you. Your registered dietitian nutritionist (RDN) may have recommended this diet because you are having difficulty eating enough calories throughout the day, you have lost weight, and/or you need to add protein to your diet. Sometimes you may not feel like eating, even if you know the importance of good nutrition. The recommendations in this handout can help you with the following: Regaining your strength and energy Keeping your body healthy Healing and recovering from surgery or illness and fighting infection Tips: Schedule Your Meals and Snacks Several small meals and snacks are often better tolerated and digested than large meals. Strategies Plan to eat 3 meals and 3 snacks daily. Experiment with timing meals to find out when you have a larger appetite. Appetite may be greatest in the morning after not eating all night so you may prefer to eat your larger meals and snacks in the morning and at lunch. Breakfast-type foods are often better tolerated so eat foods such as eggs, pancakes, waffles and cereal for any meal or snack. Carry snacks with you so you are prepared to eat every 2 to 3 hours. Determine what works best for you if your body's cues for feeling hungry or full are not working. Eat a small meal or snack even if you don't feel hungry. Set a timer to remind you when it is time to eat. Take a walk before you eat (with health care provider's approval). Light or moderate physical activity can help you maintain muscle and increase your appetite. Make Eating Enjoyable Taking steps to make the experience enjoyable  may help to increase your interest in eating and improve your appetite. Strategies: Eat with others whenever possible. Include your favorite foods to make meals more enjoyable. Try new foods. Save your beverage for the end of the meal so that you have more room for food before you get full. Add Calories to Your Meals and Snacks Try adding calorie-dense foods so that each bite provides more nutrition. Strategies Drink milk, chocolate milk, soy milk, or smoothies instead of low-calorie beverages such as diet drinks or water. Cook with milk or soy milk instead of water when making dishes such as hot cereal, cocoa, or pudding. Add jelly, jam, honey, butter or margarine to bread and crackers. Add jam or fruit to ice cream and as a topping over cake. Mix dried fruit, nuts, granola, honey, or dry cereal with yogurt or hot cereals. Enjoy snacks such as milkshakes, smoothies, pudding, ice cream, or custard. Blend a fruit smoothie of a banana, frozen berries, milk or soy milk, and 1 tablespoon nonfat powdered milk or protein powder. Add Protein to Your Meals and Snacks Choose at least one protein food at each meal and snack to increase your daily intake. Strategies Add  cup nonfat dry milk powder or protein powder to make a high-protein milk to drink or to use in recipes that call for milk. Vanilla or peppermint extract or unsweetened cocoa powder could help to boost the flavor. Add hard-cooked eggs, leftover meat, grated cheese, canned beans or tofu to noodles, rice, salads, sandwiches, soups, casseroles, pasta, tuna and other mixed dishes. Add powdered milk or  protein powder to hot cereals, meatloaf, casseroles, scrambled eggs, sauces, cream soups, and shakes. Add beans and lentils to salads, soups, casseroles, and vegetable dishes. Eat cottage cheese or yogurt, especially Greek yogurt, with fruit as a snack or dessert. Eat peanut or other nut butters on crackers, bread, toast, waffles, apples,  bananas or celery sticks. Add it to milkshakes, smoothies, or desserts. Consider a ready-made protein shake. Your RDN will make recommendations. Add Fats to Your Meals and Snacks Try adding fats to your meals and snacks. Fat provides more calories in fewer bites than carbohydrate or protein and adds flavors to your foods. Strategies Snack on nuts and seeds or add them to foods like salads, pasta, cereals, yogurt, and ice cream.  Saut or stir-fry vegetables, meats, chicken, fish or tofu in olive or canola oil.  Add olive oil, other vegetable oils, butter or margarine to soups, vegetables, potatoes, cooked cereal, rice, pasta, bread, crackers, pancakes, or waffles. Snack on olives or add to pasta, pizza, or salad. Add avocado or guacamole to your salads, sandwiches, and other entrees. Include fatty fish such as salmon in your weekly meal plan. For general food safety tips, especially for clients with immunocompromised conditions, ask your RDN for the Food Safety Nutrition Therapy handout. Small Meal and Snack Ideas These snacks and meals are recommended when you have to eat but aren't necessarily hungry.  They are good choices because they are high in protein and high in calories.  2 graham crackers 2 tablespoons peanut or other nut butter 1 cup milk 2 slices whole wheat toast topped with:  avocado, mashed Seasoning of your choice   cup Greek yogurt  cup fruit  cup granola 2 deviled egg halves 5 whole wheat crackers  1 cup cream of tomato soup  grilled cheese sandwich 1 toasted waffle topped with: 2 tablespoons peanut or nut butter 1 tablespoon jam  Trail mix made with:  cup nuts  cup dried fruit  cup cold cereal, any variety  cup oatmeal or cream of wheat cereal 1 tablespoon peanut or nut butter  cup diced fruit   High-Calorie, High-Protein Sample 1-Day Menu View Nutrient Info Breakfast 1 egg, scrambled 1 ounce cheddar cheese 1 English muffin, whole wheat 1 tablespoon  margarine 1 tablespoon jam  cup orange juice, fortified with calcium and vitamin D  Morning Snack 1 tablespoon peanut butter 1 banana 1 cup 1% milk  Lunch Tuna salad sandwich made with: 2 slices bread, whole wheat 3 ounces tuna mixed with: 1 tablespoon mayonnaise  cup pudding  Afternoon Snack  cup hummus  cup carrots 1 pita  Evening Meal Enchilada casserole made with: 2 corn tortillas 3 ounces ground beef, cooked  cup black beans, cooked  cup corn, cooked 1 ounce grated cheddar cheese  cup enchilada sauce  avocado, sliced, topping for enchilada 1 tablespoon sour cream, topping for enchilada Salad:  cup lettuce, shredded  cup tomatoes, chopped, for salad 1 tablespoon olive oil and vinegar dressing, for salad  Evening Snack  cup Greek yogurt  cup blueberries  cup granola

## 2023-12-02 NOTE — Progress Notes (Signed)
Initial Nutrition Assessment  DOCUMENTATION CODES:   Not applicable  INTERVENTION:   -Obtain new wt -Continue regular diet -MVI with minerals daily -Ensure Enlive po TID, each supplement provides 350 kcal and 20 grams of protein.  -RD provided "High Calorie, High Protein Nutrition Therapy" from [facility] Nutrition Care Manual; attached to AVS/ discharge summary -RD also provided referral to [facility] for further support and reinforcement  NUTRITION DIAGNOSIS:   Increased nutrient needs related to chronic illness (COPD) as evidenced by estimated needs.  GOAL:   Patient will meet greater than or equal to 90% of their needs  MONITOR:   PO intake, Supplement acceptance  REASON FOR ASSESSMENT:   Consult Assessment of nutrition requirement/status, Diet education  ASSESSMENT:   Pt  with history of marijuana and cocaine use, history of NSTEMI, COPD, abnormal weight loss, history of tuberculosis, hyperlipidemia history of alcohol use but denies current use who reportedly was seen at the local urgent care complaining of 1 month of cough with progressive wheezing and shortness of breath symptoms PTA.  Pt admitted with COPD exacerbation, generalized weakness, and debility.  Pt unavailable at time of visit. Attempted to speak with pt via call to hospital room phone, however, unable to reach. RD unable to obtain further nutrition-related history or complete nutrition-focused physical exam at this time.    Pt currently on a regular diet. No meal completion data available to assess at this time.   Md reports concern regarding abnormal weight loss. Last recorded wt was on 05/28/21. No wt records available in [facility]. RD will obtain new wt to better assess weight trends.   Pt with increased nutritional needs for acute illness and would benefit from addition of oral nutrition supplements. Pt is at high risk for malnutrition due to multiple  co-morbidities, however, unable to identify at this time.   Medications reviewed and include mucinex, lovenox, solu-medrol, and protonix.   Labs reviewed. Tox screen pending.   Diet Order:   Diet Order             Diet regular Room service appropriate? Yes; Fluid consistency: Thin  Diet effective now                   EDUCATION NEEDS:   No education needs have been identified at this time  Skin:  Skin Assessment: Reviewed RN Assessment  Last BM:  Unknown  Height:   Ht Readings from Last 1 Encounters:  05/28/21 5\' 9"  (1.753 m)    Weight:   Wt Readings from Last 1 Encounters:  05/28/21 56 kg    Ideal Body Weight:  72.7 kg  BMI:  There is no height or weight on file to calculate BMI.  Estimated Nutritional Needs:   Kcal:  2150-2350  Protein:  105-120 grams  Fluid:  > 2 L    Levada Schilling, RD, LDN, CDCES Registered Dietitian III Certified Diabetes Care and Education Specialist If unable to reach this RD, please use "RD Inpatient" group chat on secure chat between hours of 8am-4 pm daily

## 2023-12-02 NOTE — ED Notes (Signed)
Pt c/o burning and irritation in nose.  Pt reports discomfort started after getting COVID swabbed.  This Clinical research associate spoke to Respiratory Therapy and placed Pt on a [facility] at [employer].  Primary RN made aware.

## 2023-12-03 DIAGNOSIS — R030 Elevated blood-pressure reading, without diagnosis of hypertension: Secondary | ICD-10-CM | POA: Diagnosis not present

## 2023-12-03 DIAGNOSIS — J441 Chronic obstructive pulmonary disease with (acute) exacerbation: Principal | ICD-10-CM

## 2023-12-03 DIAGNOSIS — F1410 Cocaine abuse, uncomplicated: Secondary | ICD-10-CM

## 2023-12-03 DIAGNOSIS — J9601 Acute respiratory failure with hypoxia: Secondary | ICD-10-CM | POA: Diagnosis not present

## 2023-12-03 DIAGNOSIS — F17200 Nicotine dependence, unspecified, uncomplicated: Secondary | ICD-10-CM | POA: Diagnosis not present

## 2023-12-03 LAB — RESPIRATORY PANEL BY PCR

## 2023-12-03 LAB — HIV ANTIBODY (ROUTINE TESTING W REFLEX): HIV Screen 4th Generation wRfx: NONREACTIVE

## 2023-12-03 MED ORDER — ARFORMOTEROL TARTRATE 15 MCG/2ML IN NEBU
15.0000 ug | INHALATION_SOLUTION | Freq: Two times a day (BID) | RESPIRATORY_TRACT | Status: DC
  Administered 2023-12-03 – 2023-12-04 (×3): 15 ug via RESPIRATORY_TRACT
  Filled 2023-12-03 (×3): qty 2

## 2023-12-03 MED ORDER — BUDESONIDE 0.5 MG/2ML IN SUSP
0.5000 mg | Freq: Two times a day (BID) | RESPIRATORY_TRACT | Status: DC
  Administered 2023-12-03 – 2023-12-04 (×3): 0.5 mg via RESPIRATORY_TRACT
  Filled 2023-12-03 (×3): qty 2

## 2023-12-03 MED ORDER — FOLIC ACID 1 MG PO TABS
1.0000 mg | ORAL_TABLET | Freq: Every day | ORAL | Status: DC
  Administered 2023-12-03 – 2023-12-04 (×2): 1 mg via ORAL
  Filled 2023-12-03 (×2): qty 1

## 2023-12-03 MED ORDER — THIAMINE HCL 100 MG/ML IJ SOLN: 100.0000 mg | Freq: Every day | INTRAMUSCULAR | Status: DC

## 2023-12-03 MED ORDER — ADULT MULTIVITAMIN W/MINERALS CH
1.0000 | ORAL_TABLET | Freq: Every day | ORAL | Status: DC
  Administered 2023-12-03 – 2023-12-04 (×2): 1 via ORAL
  Filled 2023-12-03 (×2): qty 1

## 2023-12-03 MED ORDER — THIAMINE MONONITRATE 100 MG PO TABS
100.0000 mg | ORAL_TABLET | Freq: Every day | ORAL | Status: DC
  Administered 2023-12-03 – 2023-12-04 (×2): 100 mg via ORAL
  Filled 2023-12-03 (×2): qty 1

## 2023-12-03 MED ORDER — LORAZEPAM 1 MG PO TABS: 1.0000 mg | ORAL_TABLET | ORAL | Status: DC | PRN

## 2023-12-03 MED ORDER — LORAZEPAM 2 MG/ML IJ SOLN: 1.0000 mg | INTRAMUSCULAR | Status: DC | PRN

## 2023-12-03 NOTE — Progress Notes (Signed)
PROGRESS NOTE  Adam Mercado ZOX:[id number] DOB: 05-15-1966 DOA: 12/02/2023 PCP: Kara Pacer, NP  Brief History:  57 year old male lifelong smoker with history of marijuana and cocaine use, NSTEMI, COPD, history of tuberculosis, hyperlipidemia history of alcohol use but initially denies current use who reportedly was seen at the local urgent care complaining of 1 month of cough with progressive wheezing and shortness of breath symptoms.  His cough has been largelt nonproductive and he denies hemoptysis.  He denies sick contacts.  He has had subjective fevers and chills.  He does report malaise and fatigue.  He was hypoxic at the urgent care with a pulse ox in the mid 80s on room air.  He received a dose of steroid at the urgent care and was sent to the emergency department.  In the ED, the patient had low-grade temperature 99.0 F.  He was hemodynamically stable.  Oxygen saturation was 85% on room air.  He received further treatments with bronchodilators and supplemental oxygen but continues to cough and wheeze and desaturate with minimal activity.  Hospital admission was requested for further management of acute respiratory failure with hypoxia secondary to COPD exacerbation.   Assessment/Plan: Acute respiratory failure with hypoxia -Secondary COPD exacerbation -Presented with tachypnea and oxygen saturation 85% on room air -Personally reviewed chest x-ray--hyperinflation without consolidation -Wean oxygen as tolerated for saturation greater 92% -12/02/2023 CT chest diffuse moderate emphysema, no lung nodules or pulmonary consolidation  COPD exacerbation -Continue DuoNebs -Start Pulmicort -Brovana -Continue IV Solu-Medrol -COVID-19 PCR negative -Viral respiratory panel  Polysubstance abuse -UDS positive for cocaine and THC -He continues to smoke -He continues to drink alcohol -Cessation discussed  Alcohol abuse -Patient drinks 1 pint of liquor daily -Alcohol  withdrawal protocol  Essential hypertension -Patient states that he was not on any medications prior to admission  Hyperlipidemia -Patient reports that he was on some type of medication but does not remember the name  Tobacco abuse -Tobacco cessation discussed      Family Communication:  no Family at bedside  Consultants:  none  Code Status:  FULL   DVT Prophylaxis:   [place] Lovenox   Procedures: As Listed in Progress Note Above  Antibiotics: Doxy 12/26>>    Subjective: Patient states that his breathing is low but better.  Continues to have shortness of breath with exertion.  He denies any nausea, vomiting, diarrhea, abdominal pain, chest pain.  He has a nonproductive cough.  Objective: Vitals:   12/02/23 2042 12/02/23 2045 12/03/23 0044 12/03/23 0516  BP: 133/75  136/80 125/83  Pulse: 82  75 70  Resp: 20  20 20   Temp: 98.8 F (37.1 C)  99 F (37.2 C) 98.3 F (36.8 C)  TempSrc: Oral  Oral Oral  SpO2: 95% 95% 99% 97%  Weight: 53.2 kg   53.3 kg  Height: 5\' 7"  (1.702 m)       Intake/Output Summary (Last 24 hours) at 12/03/2023 0718 Last data filed at 12/03/2023 0522 Gross per 24 hour  Intake 240 ml  Output 400 ml  Net -160 ml   Weight change:  Exam:  General:  Pt is alert, follows commands appropriately, not in acute distress HEENT: No icterus, No thrush, No neck mass, [place]/AT Cardiovascular: RRR, S1/S2, no rubs, no gallops Respiratory: Diminished breath sounds bilateral.  Bibasilar rales.  Minimal basilar wheeze. Abdomen: Soft/+BS, non tender, non distended, no guarding Extremities: No edema, No lymphangitis, No petechiae, No rashes, no synovitis  Data Reviewed: I have personally reviewed following labs and imaging studies Basic Metabolic Panel: Recent Labs  Lab 12/02/23 1115  NA 139  K 4.1  CL 96*  CO2 32  GLUCOSE 94  BUN 11  CREATININE 0.77  CALCIUM 8.9   Liver Function Tests: Recent Labs  Lab 12/02/23 1115  AST 31  ALT 21  ALKPHOS  85  BILITOT 1.0  PROT 7.5  ALBUMIN 4.0   No results for input(s): "LIPASE", "AMYLASE" in the last 168 hours. No results for input(s): "AMMONIA" in the last 168 hours. Coagulation Profile: No results for input(s): "INR", "PROTIME" in the last 168 hours. CBC: Recent Labs  Lab 12/02/23 1115  WBC 7.7  HGB 15.3  HCT 45.3  MCV 100.7*  PLT 258   Cardiac Enzymes: No results for input(s): "CKTOTAL", "CKMB", "CKMBINDEX", "TROPONINI" in the last 168 hours. BNP: Invalid input(s): "POCBNP" CBG: No results for input(s): "GLUCAP" in the last 168 hours. HbA1C: No results for input(s): "HGBA1C" in the last 72 hours. Urine analysis: No results found for: "COLORURINE", "APPEARANCEUR", "LABSPEC", "PHURINE", "GLUCOSEU", "HGBUR", "BILIRUBINUR", "KETONESUR", "PROTEINUR", "UROBILINOGEN", "NITRITE", "LEUKOCYTESUR" Sepsis Labs: @LABRCNTIP (procalcitonin:4,lacticidven:4) ) Recent Results (from the past 240 hours)  Resp panel by RT-PCR (RSV, Flu A&B, Covid) Anterior Nasal Swab     Status: None   Collection Time: 12/02/23 11:15 AM   Specimen: Anterior Nasal Swab  Result Value Ref Range Status   SARS Coronavirus 2 by RT PCR NEGATIVE NEGATIVE Final    Comment: (NOTE) SARS-CoV-2 target nucleic acids are NOT DETECTED.  The SARS-CoV-2 RNA is generally detectable in upper respiratory specimens during the acute phase of infection. The lowest concentration of SARS-CoV-2 viral copies this assay can detect is 138 copies/mL. A negative result does not preclude SARS-Cov-2 infection and should not be used as the sole basis for treatment or other patient management decisions. A negative result may occur with  improper specimen collection/handling, submission of specimen other than nasopharyngeal swab, presence of viral mutation(s) within the areas targeted by this assay, and inadequate number of viral copies(<138 copies/mL). A negative result must be combined with clinical observations, patient history, and  epidemiological information. The expected result is Negative.  Fact Sheet for Patients:  BloggerCourse.com  Fact Sheet for Healthcare Providers:  SeriousBroker.it  This test is no t yet approved or cleared by the [place] FDA and  has been authorized for detection and/or diagnosis of SARS-CoV-2 by FDA under an Emergency Use Authorization (EUA). This EUA will remain  in effect (meaning this test can be used) for the duration of the COVID-19 declaration under Section 564(b)(1) of the Act, 21 U.S.C.section 360bbb-3(b)(1), unless the authorization is terminated  or revoked sooner.       Influenza A by PCR NEGATIVE NEGATIVE Final   Influenza B by PCR NEGATIVE NEGATIVE Final    Comment: (NOTE) The Xpert Xpress SARS-CoV-2/FLU/RSV plus assay is intended as an aid in the diagnosis of influenza from Nasopharyngeal swab specimens and should not be used as a sole basis for treatment. Nasal washings and aspirates are unacceptable for Xpert Xpress SARS-CoV-2/FLU/RSV testing.  Fact Sheet for Patients: BloggerCourse.com  Fact Sheet for Healthcare Providers: SeriousBroker.it  This test is not yet approved or cleared by the [place] FDA and has been authorized for detection and/or diagnosis of SARS-CoV-2 by FDA under an Emergency Use Authorization (EUA). This EUA will remain in effect (meaning this test can be used) for the duration of the COVID-19 declaration under Section 564(b)(1) of the Act,  21 U.S.C. section 360bbb-3(b)(1), unless the authorization is terminated or revoked.     Resp Syncytial Virus by PCR NEGATIVE NEGATIVE Final    Comment: (NOTE) Fact Sheet for Patients: BloggerCourse.com  Fact Sheet for Healthcare Providers: SeriousBroker.it  This test is not yet approved or cleared by the [place] FDA and has been  authorized for detection and/or diagnosis of SARS-CoV-2 by FDA under an Emergency Use Authorization (EUA). This EUA will remain in effect (meaning this test can be used) for the duration of the COVID-19 declaration under Section 564(b)(1) of the Act, 21 U.S.C. section 360bbb-3(b)(1), unless the authorization is terminated or revoked.  Performed at [facility], [address] [place], [place] [address]      Scheduled Meds:  aspirin EC  81 mg Oral QPM   doxycycline  100 mg Oral Q12H   enoxaparin (LOVENOX) injection  40 mg Subcutaneous Q24H   feeding supplement  237 mL Oral TID BM   guaiFENesin  600 mg Oral BID   ipratropium-albuterol  3 mL Nebulization TID   methylPREDNISolone (SOLU-MEDROL) injection  40 mg Intravenous Q12H   multivitamin with minerals  1 tablet Oral Q supper   pantoprazole  40 mg Oral QHS   Continuous Infusions:  Procedures/Studies: CT CHEST WO CONTRAST Result Date: 12/02/2023 CLINICAL DATA:  Shortness of breath and cough. EXAM: CT CHEST WITHOUT CONTRAST TECHNIQUE: Multidetector CT imaging of the chest was performed following the standard protocol without IV contrast. RADIATION DOSE REDUCTION: This exam was performed according to the departmental dose-optimization program which includes automated exposure control, adjustment of the mA and/or kV according to patient size and/or use of iterative reconstruction technique. COMPARISON:  None Available. FINDINGS: Cardiovascular: Normal cardiac size. No pericardial effusion. No aortic aneurysm. There are coronary artery calcifications, in keeping with coronary artery disease. There are also mild peripheral atherosclerotic vascular calcifications of thoracic aorta and its major branches. Mediastinum/Nodes: Visualized thyroid gland appears grossly unremarkable. No solid / cystic mediastinal masses. The esophagus is nondistended precluding optimal assessment. No mediastinal or axillary lymphadenopathy by size criteria. Evaluation of  bilateral hila is limited due to lack on intravenous contrast: however, no large hilar lymphadenopathy identified. Lungs/Pleura: The central tracheo-bronchial tree is patent. There are diffuse moderate centrilobular emphysematous changes throughout bilateral lungs. No mass or consolidation. No pleural effusion or pneumothorax. No suspicious lung nodules. Upper Abdomen: Visualized upper abdominal viscera within normal limits. Musculoskeletal: The visualized soft tissues of the chest wall are grossly unremarkable. No suspicious osseous lesions. There are mild multilevel degenerative changes in the visualized spine. IMPRESSION: *Diffuse moderate centrilobular emphysema. *No lung mass, consolidation, pleural effusion or pneumothorax. No suspicious lung nodule. *Multiple other nonacute observations, as described above. Aortic Atherosclerosis (ICD10-I70.0) and Emphysema (ICD10-J43.9). Electronically Signed   By: Jules Schick M.D.   On: 12/02/2023 16:11   DG Chest Port 1 View Result Date: 12/02/2023 CLINICAL DATA:  Cough, SOB. EXAM: PORTABLE CHEST - 1 VIEW COMPARISON:  04/21/2022. FINDINGS: The heart size and mediastinal contours are within normal limits. Lungs are hyperinflated suggesting COPD. There is no focal consolidation. Upper right-sided cm sized nodule appears to be a new finding that can be evaluated further with noncontrast chest CT. No pneumothorax or pleural effusion. The visualized skeletal structures are unremarkable. IMPRESSION: 1. COPD. No acute cardiopulmonary process. 2. Right upper lobe nodule. Recommend further evaluation with noncontrast chest CT. Electronically Signed   By: Layla Maw M.D.   On: 12/02/2023 12:56    Catarina Hartshorn, DO  [facility]  If 7PM-7AM, please contact night-coverage [email or web address] Password [email or web address] 12/03/2023, 7:18 AM   LOS: 1 day

## 2023-12-03 NOTE — TOC Initial Note (Signed)
Transition of Care [facility]) - Initial/Assessment Note    Patient Details  Name: Adam Mercado MRN: [id number] Date of Birth: 31-Aug-1966  Transition of Care [facility]) CM/SW Contact:    Elliot Gault, LCSW Phone Number: 12/03/2023, 10:59 AM  Clinical Narrative:                  Pt admitted from home. Received consults for medication assistance and SA treatment resources.  Pt is from home, Independent in ADLs. SA treatment resource information provided and encouraged follow up. Pt has [employer] and Medicaid secondary. He should have prescription drug coverage. No further assistance programs available.  Resource list provided to address SDOH concerns.  TOC will follow and continue to assess and assist with dc planning as needed.   Expected Discharge Plan: Home/Self Care Barriers to Discharge: Continued Medical Work up   Patient Goals and CMS Choice Patient states their goals for this hospitalization and ongoing recovery are:: get better          Expected Discharge Plan and Services In-house Referral: Clinical Social Work     Living arrangements for the past 2 months: Single Family Home                                      Prior Living Arrangements/Services Living arrangements for the past 2 months: Single Family Home Lives with:: Significant Other Patient language and need for interpreter reviewed:: Yes Do you feel safe going back to the place where you live?: Yes      Need for Family Participation in Patient Care: No (Comment)     Criminal Activity/Legal Involvement Pertinent to Current Situation/Hospitalization: No - Comment as needed  Activities of Daily Living   ADL Screening (condition at time of admission) Independently performs ADLs?: Yes (appropriate for developmental age) Is the patient deaf or have difficulty hearing?: No Does the patient have difficulty seeing, even when wearing glasses/contacts?: No Does the patient have difficulty concentrating,  remembering, or making decisions?: No  Permission Sought/Granted                  Emotional Assessment Appearance:: Appears stated age   Affect (typically observed): Accepting Orientation: : Oriented to Self, Oriented to Place, Oriented to  Time, Oriented to Situation Alcohol / Substance Use: Illicit Drugs Psych Involvement: No (comment)  Admission diagnosis:  Tobacco use disorder [F17.200] Loss of weight [R63.4] Elevated blood pressure reading [R03.0] COPD exacerbation (HCC) [J44.1] Acute respiratory failure with hypoxia (HCC) [J96.01] Nodule of upper lobe of right lung [R91.1] Patient Active Problem List   Diagnosis Date Noted   COPD exacerbation (HCC) 12/03/2023   Elevated blood pressure reading 12/03/2023   Acute respiratory failure with hypoxia (HCC) 12/02/2023   Oxygen desaturation 12/02/2023   Generalized weakness 12/02/2023   Nodule of upper lobe of right lung 12/02/2023   Essential hypertension 12/02/2023   Hyperlipidemia 12/02/2023   Loss of weight 05/28/2021   Encounter for screening colonoscopy 05/28/2021   Unstable angina (HCC) 08/23/2016   Cocaine abuse (HCC) 08/23/2016   Marijuana abuse 08/23/2016   Smoker 08/23/2016   Elevated troponin 08/23/2016   NSTEMI (non-ST elevated myocardial infarction) (HCC)    PCP:  Kara Pacer, NP Pharmacy:   [id number] - [place], [place] - [address] AT [facility] [address] [place]  [address] Phone: [phone] Fax: [phone]     Social Drivers of Health (SDOH)  Social History: SDOH Screenings   Food Insecurity: No Food Insecurity (12/02/2023)  Housing: Low Risk  (12/02/2023)  Transportation Needs: Unmet Transportation Needs (12/02/2023)  Utilities: Not At Risk (12/02/2023)  Tobacco Use: High Risk (12/02/2023)   SDOH Interventions: Transportation Interventions: Inpatient TOC   Readmission Risk Interventions     No data to display

## 2023-12-03 NOTE — Plan of Care (Signed)
  Problem: Education: Goal: Knowledge of General Education information will improve Description: Including pain rating scale, medication(s)/side effects and non-pharmacologic comfort measures Outcome: Progressing   Problem: Health Behavior/Discharge Planning: Goal: Ability to manage health-related needs will improve Outcome: Progressing   Problem: Clinical Measurements: Goal: Ability to maintain clinical measurements within normal limits will improve Outcome: Progressing Goal: Will remain free from infection Outcome: Progressing Goal: Diagnostic test results will improve Outcome: Progressing Goal: Respiratory complications will improve Outcome: Progressing Goal: Cardiovascular complication will be avoided Outcome: Progressing   Problem: Activity: Goal: Risk for activity intolerance will decrease Outcome: Progressing   Problem: Nutrition: Goal: Adequate nutrition will be maintained Outcome: Progressing   Problem: Coping: Goal: Level of anxiety will decrease Outcome: Progressing   Problem: Skin Integrity: Goal: Risk for impaired skin integrity will decrease Outcome: Progressing   Problem: Safety: Goal: Ability to remain free from injury will improve Outcome: Progressing

## 2023-12-04 DIAGNOSIS — F17200 Nicotine dependence, unspecified, uncomplicated: Secondary | ICD-10-CM | POA: Diagnosis not present

## 2023-12-04 DIAGNOSIS — F1410 Cocaine abuse, uncomplicated: Secondary | ICD-10-CM | POA: Diagnosis not present

## 2023-12-04 DIAGNOSIS — J9601 Acute respiratory failure with hypoxia: Secondary | ICD-10-CM | POA: Diagnosis not present

## 2023-12-04 DIAGNOSIS — J441 Chronic obstructive pulmonary disease with (acute) exacerbation: Secondary | ICD-10-CM | POA: Diagnosis not present

## 2023-12-04 MED ORDER — DOXYCYCLINE HYCLATE 100 MG PO TABS: 100.0000 mg | ORAL_TABLET | Freq: Two times a day (BID) | ORAL | 0 refills | Status: DC

## 2023-12-04 MED ORDER — PREDNISONE 20 MG PO TABS
50.0000 mg | ORAL_TABLET | Freq: Every day | ORAL | Status: DC
Start: 2023-12-05 — End: 2023-12-04

## 2023-12-04 MED ORDER — PREDNISONE 50 MG PO TABS: 50.0000 mg | ORAL_TABLET | Freq: Every day | ORAL | 0 refills | Status: DC

## 2023-12-04 NOTE — Progress Notes (Signed)
SATURATION QUALIFICATIONS: (This note is used to comply with regulatory documentation for home oxygen)  Patient Saturations on Room Air at Rest = 94%  Patient Saturations on Room Air while Ambulating = 95%  Patient Saturations on  Liters of oxygen while Ambulating = 95%  Please briefly explain why patient needs home oxygen: Patient does not meet criteria for home oxygen at this time, his o2 sat was 95% on room air while ambulating around room.

## 2023-12-04 NOTE — Plan of Care (Signed)

## 2023-12-04 NOTE — Discharge Summary (Signed)
Physician Discharge Summary   Patient: Adam Mercado MRN: [id number] DOB: 30-Sep-1966  Admit date:     12/02/2023  Discharge date: 12/04/23  Discharge Physician: Onalee Hua Larrie Lucia   PCP: Kara Pacer, NP   Recommendations at discharge:   Please follow up with primary care provider within 1-2 weeks  Please repeat BMP and CBC in one week     Hospital Course: 57 year old male lifelong smoker with history of marijuana and cocaine use, NSTEMI, COPD, history of tuberculosis, hyperlipidemia history of alcohol use but initially denies current use who reportedly was seen at the local urgent care complaining of 1 month of cough with progressive wheezing and shortness of breath symptoms.  His cough has been largelt nonproductive and he denies hemoptysis.  He denies sick contacts.  He has had subjective fevers and chills.  He does report malaise and fatigue.  He was hypoxic at the urgent care with a pulse ox in the mid 80s on room air.  He received a dose of steroid at the urgent care and was sent to the emergency department.  In the ED, the patient had low-grade temperature 99.0 F.  He was hemodynamically stable.  Oxygen saturation was 85% on room air.  He received further treatments with bronchodilators and supplemental oxygen but continues to cough and wheeze and desaturate with minimal activity.  Hospital admission was requested for further management of acute respiratory failure with hypoxia secondary to COPD exacerbation.  Assessment and Plan: Acute respiratory failure with hypoxia -Secondary COPD exacerbation -Presented with tachypnea and oxygen saturation 85% on room air -Personally reviewed chest x-ray--hyperinflation without consolidation -Wean oxygen as tolerated for saturation greater 92% -initially on 3L -12/02/2023 CT chest diffuse moderate emphysema, no lung nodules or pulmonary consolidation -weaned to RA--ambulated on day of d/c on RA without desaturatin <90%   COPD  exacerbation -Continued DuoNebs -Started Pulmicort -Started Brovana -Continue IV Solu-Medrol -COVID-19 PCR negative -Viral respiratory panel--neg -d/c home with prednisone x 3 more days -d/c home with doxy x 3 more days after d/c   Polysubstance abuse -UDS positive for cocaine and THC -He continues to smoke -He continues to drink alcohol -Cessation discussed   Alcohol abuse -Patient drinks 1 pint of liquor daily -Alcohol withdrawal protocol -no signs of withdrawal   Essential hypertension -Patient states that he was not on any medications prior to admission   Hyperlipidemia -Patient reports that he was on some type of medication but does not remember the name   Tobacco abuse -Tobacco cessation discussed          Consultants: none Procedures performed: none  Disposition: Home Diet recommendation:  Cardiac diet DISCHARGE MEDICATION: Allergies as of 12/04/2023   No Known Allergies      Medication List     TAKE these medications    albuterol 108 (90 Base) MCG/ACT inhaler Commonly known as: Proventil HFA INHALE 2 PUFFS BY MOUTH EVERY 6 HOURS AS NEEDED FOR COUGHING, WHEEZING, OR SHORTNESS OF BREATH   aspirin EC 81 MG tablet Take 1 tablet (81 mg total) by mouth daily. What changed: when to take this   doxycycline 100 MG tablet Commonly known as: VIBRA-TABS Take 1 tablet (100 mg total) by mouth every 12 (twelve) hours.   ibuprofen 200 MG tablet Commonly known as: ADVIL Take 400 mg by mouth 2 (two) times daily as needed for moderate pain (pain score 4-6) or headache.   predniSONE 50 MG tablet Commonly known as: DELTASONE Take 1 tablet (50 mg total) by mouth daily  with breakfast. Start taking on: December 05, 2023        Follow-up Information     [facility] In 1 week.   Specialty: Pulmonology Contact information: [address] [place] [address] [phone]               Discharge  Exam: Filed Weights   12/02/23 2042 12/03/23 0516 12/04/23 0406  Weight: 53.2 kg 53.3 kg 53 kg   HEENT:  [place]/AT, No thrush, no icterus CV:  RRR, no rub, no S3, no S4 Lung:  bibasilar rales.  No wheeze Abd:  soft/+BS, NT Ext:  No edema, no lymphangitis, no synovitis, no rash   Condition at discharge: stable  The results of significant diagnostics from this hospitalization (including imaging, microbiology, ancillary and laboratory) are listed below for reference.   Imaging Studies: CT CHEST WO CONTRAST Result Date: 12/02/2023 CLINICAL DATA:  Shortness of breath and cough. EXAM: CT CHEST WITHOUT CONTRAST TECHNIQUE: Multidetector CT imaging of the chest was performed following the standard protocol without IV contrast. RADIATION DOSE REDUCTION: This exam was performed according to the departmental dose-optimization program which includes automated exposure control, adjustment of the mA and/or kV according to patient size and/or use of iterative reconstruction technique. COMPARISON:  None Available. FINDINGS: Cardiovascular: Normal cardiac size. No pericardial effusion. No aortic aneurysm. There are coronary artery calcifications, in keeping with coronary artery disease. There are also mild peripheral atherosclerotic vascular calcifications of thoracic aorta and its major branches. Mediastinum/Nodes: Visualized thyroid gland appears grossly unremarkable. No solid / cystic mediastinal masses. The esophagus is nondistended precluding optimal assessment. No mediastinal or axillary lymphadenopathy by size criteria. Evaluation of bilateral hila is limited due to lack on intravenous contrast: however, no large hilar lymphadenopathy identified. Lungs/Pleura: The central tracheo-bronchial tree is patent. There are diffuse moderate centrilobular emphysematous changes throughout bilateral lungs. No mass or consolidation. No pleural effusion or pneumothorax. No suspicious lung nodules. Upper Abdomen: Visualized  upper abdominal viscera within normal limits. Musculoskeletal: The visualized soft tissues of the chest wall are grossly unremarkable. No suspicious osseous lesions. There are mild multilevel degenerative changes in the visualized spine. IMPRESSION: *Diffuse moderate centrilobular emphysema. *No lung mass, consolidation, pleural effusion or pneumothorax. No suspicious lung nodule. *Multiple other nonacute observations, as described above. Aortic Atherosclerosis (ICD10-I70.0) and Emphysema (ICD10-J43.9). Electronically Signed   By: Jules Schick M.D.   On: 12/02/2023 16:11   DG Chest Port 1 View Result Date: 12/02/2023 CLINICAL DATA:  Cough, SOB. EXAM: PORTABLE CHEST - 1 VIEW COMPARISON:  04/21/2022. FINDINGS: The heart size and mediastinal contours are within normal limits. Lungs are hyperinflated suggesting COPD. There is no focal consolidation. Upper right-sided cm sized nodule appears to be a new finding that can be evaluated further with noncontrast chest CT. No pneumothorax or pleural effusion. The visualized skeletal structures are unremarkable. IMPRESSION: 1. COPD. No acute cardiopulmonary process. 2. Right upper lobe nodule. Recommend further evaluation with noncontrast chest CT. Electronically Signed   By: Layla Maw M.D.   On: 12/02/2023 12:56    Microbiology: Results for orders placed or performed during the hospital encounter of 12/02/23  Resp panel by RT-PCR (RSV, Flu A&B, Covid) Anterior Nasal Swab     Status: None   Collection Time: 12/02/23 11:15 AM   Specimen: Anterior Nasal Swab  Result Value Ref Range Status   SARS Coronavirus 2 by RT PCR NEGATIVE NEGATIVE Final    Comment: (NOTE) SARS-CoV-2 target nucleic acids are NOT DETECTED.  The SARS-CoV-2 RNA is generally detectable in upper respiratory specimens during the acute phase of infection. The lowest concentration of SARS-CoV-2 viral copies this assay can detect is 138 copies/mL. A negative result does not preclude  SARS-Cov-2 infection and should not be used as the sole basis for treatment or other patient management decisions. A negative result may occur with  improper specimen collection/handling, submission of specimen other than nasopharyngeal swab, presence of viral mutation(s) within the areas targeted by this assay, and inadequate number of viral copies(<138 copies/mL). A negative result must be combined with clinical observations, patient history, and epidemiological information. The expected result is Negative.  Fact Sheet for Patients:  BloggerCourse.com  Fact Sheet for Healthcare Providers:  SeriousBroker.it  This test is no t yet approved or cleared by the [place] FDA and  has been authorized for detection and/or diagnosis of SARS-CoV-2 by FDA under an Emergency Use Authorization (EUA). This EUA will remain  in effect (meaning this test can be used) for the duration of the COVID-19 declaration under Section 564(b)(1) of the Act, 21 U.S.C.section 360bbb-3(b)(1), unless the authorization is terminated  or revoked sooner.       Influenza A by PCR NEGATIVE NEGATIVE Final   Influenza B by PCR NEGATIVE NEGATIVE Final    Comment: (NOTE) The Xpert Xpress SARS-CoV-2/FLU/RSV plus assay is intended as an aid in the diagnosis of influenza from Nasopharyngeal swab specimens and should not be used as a sole basis for treatment. Nasal washings and aspirates are unacceptable for Xpert Xpress SARS-CoV-2/FLU/RSV testing.  Fact Sheet for Patients: BloggerCourse.com  Fact Sheet for Healthcare Providers: SeriousBroker.it  This test is not yet approved or cleared by the [place] FDA and has been authorized for detection and/or diagnosis of SARS-CoV-2 by FDA under an Emergency Use Authorization (EUA). This EUA will remain in effect (meaning this test can be used) for the duration of  the COVID-19 declaration under Section 564(b)(1) of the Act, 21 U.S.C. section 360bbb-3(b)(1), unless the authorization is terminated or revoked.     Resp Syncytial Virus by PCR NEGATIVE NEGATIVE Final    Comment: (NOTE) Fact Sheet for Patients: BloggerCourse.com  Fact Sheet for Healthcare Providers: SeriousBroker.it  This test is not yet approved or cleared by the [place] FDA and has been authorized for detection and/or diagnosis of SARS-CoV-2 by FDA under an Emergency Use Authorization (EUA). This EUA will remain in effect (meaning this test can be used) for the duration of the COVID-19 declaration under Section 564(b)(1) of the Act, 21 U.S.C. section 360bbb-3(b)(1), unless the authorization is terminated or revoked.  Performed at [facility], [address] [place], [place] [address]   Respiratory (~20 pathogens) panel by PCR     Status: None   Collection Time: 12/03/23  7:24 AM   Specimen: Nasopharyngeal Swab; Respiratory  Result Value Ref Range Status   Adenovirus NOT DETECTED NOT DETECTED Final   Coronavirus 229E NOT DETECTED NOT DETECTED Final    Comment: (NOTE) The Coronavirus on the Respiratory Panel, DOES NOT test for the novel  Coronavirus (2019 nCoV)    Coronavirus HKU1 NOT DETECTED NOT DETECTED Final   Coronavirus NL63 NOT DETECTED NOT DETECTED Final   Coronavirus OC43 NOT DETECTED NOT DETECTED Final   Metapneumovirus NOT DETECTED NOT DETECTED Final   Rhinovirus / Enterovirus NOT DETECTED NOT DETECTED Final   Influenza A NOT DETECTED NOT DETECTED Final   Influenza B NOT DETECTED NOT DETECTED Final   Parainfluenza Virus 1 NOT DETECTED NOT  DETECTED Final   Parainfluenza Virus 2 NOT DETECTED NOT DETECTED Final   Parainfluenza Virus 3 NOT DETECTED NOT DETECTED Final   Parainfluenza Virus 4 NOT DETECTED NOT DETECTED Final   Respiratory Syncytial Virus NOT DETECTED NOT DETECTED Final   Bordetella pertussis  NOT DETECTED NOT DETECTED Final   Bordetella Parapertussis NOT DETECTED NOT DETECTED Final   Chlamydophila pneumoniae NOT DETECTED NOT DETECTED Final   Mycoplasma pneumoniae NOT DETECTED NOT DETECTED Final    Comment: Performed at [facility], [address] [place], [place] [address]    Labs: CBC: Recent Labs  Lab 12/02/23 1115  WBC 7.7  HGB 15.3  HCT 45.3  MCV 100.7*  PLT 258   Basic Metabolic Panel: Recent Labs  Lab 12/02/23 1115  NA 139  K 4.1  CL 96*  CO2 32  GLUCOSE 94  BUN 11  CREATININE 0.77  CALCIUM 8.9   Liver Function Tests: Recent Labs  Lab 12/02/23 1115  AST 31  ALT 21  ALKPHOS 85  BILITOT 1.0  PROT 7.5  ALBUMIN 4.0   CBG: No results for input(s): "GLUCAP" in the last 168 hours.  Discharge time spent: greater than 30 minutes.  Signed: Catarina Hartshorn, MD [facility] 12/04/2023

## 2023-12-04 NOTE — TOC Transition Note (Signed)
Transition of Care [facility]) - Discharge Note   Patient Details  Name: Adam Mercado MRN: [id number] Date of Birth: [birth year]  Transition of Care (TOC) CM/SW Contact:  Armanda Heritage, RN Phone Number: 12/04/2023, 12:07 PM   Clinical Narrative:    Patient is anticipated to discharge home today, noted desaturation screen with no home oxygen needs.  No TOC needs identified.    Final next level of care: Home/Self Care Barriers to Discharge: Continued Medical Work up   Patient Goals and CMS Choice Patient states their goals for this hospitalization and ongoing recovery are:: get better          Discharge Placement                       Discharge Plan and Services Additional resources added to the After Visit Summary for   In-house Referral: Clinical Social Work                                   Social Drivers of Health (SDOH) Interventions SDOH Screenings   Food Insecurity: No Food Insecurity (12/02/2023)  Housing: Low Risk  (12/02/2023)  Transportation Needs: Unmet Transportation Needs (12/02/2023)  Utilities: Not At Risk (12/02/2023)  Tobacco Use: High Risk (12/02/2023)     Readmission Risk Interventions     No data to display

## 2023-12-04 NOTE — Plan of Care (Signed)
  Problem: Education: Goal: Knowledge of General Education information will improve Description Including pain rating scale, medication(s)/side effects and non-pharmacologic comfort measures Outcome: Progressing   Problem: Health Behavior/Discharge Planning: Goal: Ability to manage health-related needs will improve Outcome: Progressing   

## 2024-01-04 NOTE — Progress Notes (Deleted)
   Adam Mercado, male    DOB: 02/16/1966    MRN: [id number]   Brief patient profile:  ***  yo*** *** referred to pulmonary clinic in [place]  01/05/2024 by *** for ***      History of Present Illness  01/05/2024  Pulmonary/ 1st office eval/ Sherene Sires / [place] [facility]  No chief complaint on file.    Dyspnea:  *** Cough: *** Sleep: *** SABA use: *** 02: *** LDSCT:***  No obvious day to day or daytime pattern/variability or assoc excess/ purulent sputum or mucus plugs or hemoptysis or cp or chest tightness, subjective wheeze or overt sinus or hb symptoms.    Also denies any obvious fluctuation of symptoms with weather or environmental changes or other aggravating or alleviating factors except as outlined above   No unusual exposure hx or h/o childhood pna/ asthma or knowledge of premature birth.  Current Allergies, Complete Past Medical History, Past Surgical History, Family History, and Social History were reviewed in [employer] record.  ROS  The following are not active complaints unless bolded Hoarseness, sore throat, dysphagia, dental problems, itching, sneezing,  nasal congestion or discharge of excess mucus or purulent secretions, ear ache,   fever, chills, sweats, unintended wt loss or wt gain, classically pleuritic or exertional cp,  orthopnea pnd or arm/hand swelling  or leg swelling, presyncope, palpitations, abdominal pain, anorexia, nausea, vomiting, diarrhea  or change in bowel habits or change in bladder habits, change in stools or change in urine, dysuria, hematuria,  rash, arthralgias, visual complaints, headache, numbness, weakness or ataxia or problems with walking or coordination,  change in mood or  memory.            Outpatient Medications Prior to Visit  Medication Sig Dispense Refill   albuterol (PROVENTIL HFA) 108 (90 Base) MCG/ACT inhaler INHALE 2 PUFFS BY MOUTH EVERY 6 HOURS AS NEEDED FOR COUGHING, WHEEZING, OR SHORTNESS OF BREATH  20.1 g 1   aspirin EC 81 MG EC tablet Take 1 tablet (81 mg total) by mouth daily. (Patient taking differently: Take 81 mg by mouth every evening.) 30 tablet 0   doxycycline (VIBRA-TABS) 100 MG tablet Take 1 tablet (100 mg total) by mouth every 12 (twelve) hours. 6 tablet 0   ibuprofen (ADVIL) 200 MG tablet Take 400 mg by mouth 2 (two) times daily as needed for moderate pain (pain score 4-6) or headache.     predniSONE (DELTASONE) 50 MG tablet Take 1 tablet (50 mg total) by mouth daily with breakfast. 3 tablet 0   No facility-administered medications prior to visit.    Past Medical History:  Diagnosis Date   Hypercholesterolemia    Tuberculosis       Objective:     There were no vitals taken for this visit.         Assessment   No problem-specific Assessment & Plan notes found for this encounter.     Sandrea Hughs, MD 01/04/2024

## 2024-01-05 ENCOUNTER — Institutional Professional Consult (permissible substitution): Payer: Commercial Managed Care - HMO | Admitting: Internal Medicine

## 2024-01-05 ENCOUNTER — Encounter: Payer: Self-pay | Admitting: Internal Medicine

## 2024-01-06 ENCOUNTER — Other Ambulatory Visit: Payer: Self-pay

## 2024-01-06 ENCOUNTER — Emergency Department (HOSPITAL_COMMUNITY)
Admission: EM | Admit: 2024-01-06 | Discharge: 2024-01-06 | Discharge status: Against Medical Advice | Payer: Commercial Managed Care - HMO | Attending: Emergency Medicine | Admitting: Emergency Medicine

## 2024-01-06 ENCOUNTER — Encounter (HOSPITAL_COMMUNITY): Payer: Self-pay

## 2024-01-06 ENCOUNTER — Emergency Department (HOSPITAL_COMMUNITY): Payer: Commercial Managed Care - HMO

## 2024-01-06 DIAGNOSIS — R079 Chest pain, unspecified: Secondary | ICD-10-CM | POA: Diagnosis present

## 2024-01-06 DIAGNOSIS — R0602 Shortness of breath: Secondary | ICD-10-CM | POA: Insufficient documentation

## 2024-01-06 DIAGNOSIS — Z5321 Procedure and treatment not carried out due to patient leaving prior to being seen by health care provider: Secondary | ICD-10-CM | POA: Insufficient documentation

## 2024-01-06 LAB — COMPREHENSIVE METABOLIC PANEL
ALT: 61 U/L — ABNORMAL HIGH (ref 0–44)
AST: 75 U/L — ABNORMAL HIGH (ref 15–41)
Albumin: 4.3 g/dL (ref 3.5–5.0)
Alkaline Phosphatase: 81 U/L (ref 38–126)
Anion gap: 10 (ref 5–15)
BUN: 13 mg/dL (ref 6–20)
CO2: 27 mmol/L (ref 22–32)
Calcium: 9.6 mg/dL (ref 8.9–10.3)
Chloride: 101 mmol/L (ref 98–111)
Creatinine, Ser: 0.93 mg/dL (ref 0.61–1.24)
GFR, Estimated: >60 mL/min (ref >60)
Glucose, Bld: 101 mg/dL — ABNORMAL HIGH (ref 70–99)
Potassium: 3.9 mmol/L (ref 3.5–5.1)
Sodium: 138 mmol/L (ref 135–145)
Total Bilirubin: 1.4 mg/dL — ABNORMAL HIGH (ref 0.0–1.2)
Total Protein: 8.1 g/dL (ref 6.5–8.1)

## 2024-01-06 LAB — CBC WITH DIFFERENTIAL/PLATELET
Abs Immature Granulocytes: 0.02 10*3/uL (ref 0.00–0.07)
Basophils Absolute: 0 10*3/uL (ref 0.0–0.1)
Basophils Relative: 0 %
Eosinophils Absolute: 0.1 10*3/uL (ref 0.0–0.5)
Eosinophils Relative: 1 %
HCT: 46.9 % (ref 39.0–52.0)
Hemoglobin: 15.8 g/dL (ref 13.0–17.0)
Immature Granulocytes: 0 %
Lymphocytes Relative: 4 %
Lymphs Abs: 0.5 10*3/uL — ABNORMAL LOW (ref 0.7–4.0)
MCH: 33.9 pg (ref 26.0–34.0)
MCHC: 33.7 g/dL (ref 30.0–36.0)
MCV: 100.6 fL — ABNORMAL HIGH (ref 80.0–100.0)
Monocytes Absolute: 0.2 10*3/uL (ref 0.1–1.0)
Monocytes Relative: 2 %
Neutro Abs: 10.2 10*3/uL — ABNORMAL HIGH (ref 1.7–7.7)
Neutrophils Relative %: 93 %
Platelets: 261 10*3/uL (ref 150–400)
RBC: 4.66 MIL/uL (ref 4.22–5.81)
RDW: 12.4 % (ref 11.5–15.5)
WBC: 10.9 10*3/uL — ABNORMAL HIGH (ref 4.0–10.5)
nRBC: 0 % (ref 0.0–0.2)

## 2024-01-06 LAB — TROPONIN I (HIGH SENSITIVITY)
Troponin I (High Sensitivity): 4 ng/L (ref <18)
Troponin I (High Sensitivity): 4 ng/L (ref <18)

## 2024-01-06 NOTE — ED Provider Triage Note (Signed)
Emergency Medicine Provider Triage Evaluation Note  Adam Mercado , a 59 y.o. male  was evaluated in triage.  Pt complains of multiple.  Review of Systems  Positive: CP, diarrhea, vomiting, SOB, bites behind ear Negative: fever  Physical Exam  There were no vitals taken for this visit. Gen:   Awake, no distress   Resp:  Normal effort  MSK:   Moves extremities without difficulty  Other:  Multiple bites behind right ear, ?bed bugs  Medical Decision Making  Medically screening exam initiated at 1:09 PM.  Appropriate orders placed.  Adam Mercado was informed that the remainder of the evaluation will be completed by another provider, this initial triage assessment does not replace that evaluation, and the importance of remaining in the ED until their evaluation is complete.  Symptoms since yesterday as above, CP and SOB starting today. No fever.    Adam Anis, PA-C 01/06/24 1311

## 2024-01-06 NOTE — ED Triage Notes (Signed)
Pt arrived via POV with multiple complaints. Pt endorses recent insect bite to posterior scalp, chest pain, emesis, and diarrhea.

## 2024-01-06 NOTE — ED Notes (Signed)
ED Provider at bedside.

## 2024-01-13 ENCOUNTER — Ambulatory Visit: Payer: Commercial Managed Care - HMO | Admitting: Nutrition

## 2024-02-06 NOTE — Progress Notes (Deleted)
 Adam Mercado, male    DOB: 11/19/1966    MRN: [id number]   Brief patient profile:  ***  yo*** *** referred to pulmonary clinic in [place]  02/08/2024 by *** for ***   Admit date:     12/02/2023  Discharge date: 12/04/23    Hospital Course: 58 year old male lifelong smoker with history of marijuana and cocaine use, NSTEMI, COPD, history of tuberculosis, hyperlipidemia history of alcohol use but initially denies current use who reportedly was seen at the local urgent care complaining of 1 month of cough with progressive wheezing and shortness of breath symptoms.  His cough has been largelt nonproductive and he denies hemoptysis.  He denies sick contacts.  He has had subjective fevers and chills.  He does report malaise and fatigue.  He was hypoxic at the urgent care with a pulse ox in the mid 80s on room air.  He received a dose of steroid at the urgent care and was sent to the emergency department.  In the ED, the patient had low-grade temperature 99.0 F.  He was hemodynamically stable.  Oxygen saturation was 85% on room air.  He received further treatments with bronchodilators and supplemental oxygen but continues to cough and wheeze and desaturate with minimal activity.  Hospital admission was requested for further management of acute respiratory failure with hypoxia secondary to COPD exacerbation.   Assessment and Plan: Acute respiratory failure with hypoxia -Secondary COPD exacerbation -Presented with tachypnea and oxygen saturation 85% on room air -Personally reviewed chest x-ray--hyperinflation without consolidation -Wean oxygen as tolerated for saturation greater 92% -initially on 3L -12/02/2023 CT chest diffuse moderate emphysema, no lung nodules or pulmonary consolidation -weaned to RA--ambulated on day of d/c on RA without desaturatin <90%   COPD exacerbation -Continued DuoNebs -Started Pulmicort -Started Brovana -Continue IV Solu-Medrol -COVID-19 PCR negative -Viral  respiratory panel--neg -d/c home with prednisone x 3 more days -d/c home with doxy x 3 more days after d/c   Polysubstance abuse -UDS positive for cocaine and THC -He continues to smoke -He continues to drink alcohol -Cessation discussed   Alcohol abuse -Patient drinks 1 pint of liquor daily -Alcohol withdrawal protocol -no signs of withdrawal   Essential hypertension -Patient states that he was not on any medications prior to admission   Hyperlipidemia -Patient reports that he was on some type of medication but does not remember the name   Tobacco abuse -Tobacco cessation discussed TAKE these medications     albuterol 108 (90 Base) MCG/ACT inhaler Commonly known as: Proventil HFA INHALE 2 PUFFS BY MOUTH EVERY 6 HOURS AS NEEDED FOR COUGHING, WHEEZING, OR SHORTNESS OF BREATH    aspirin EC 81 MG tablet Take 1 tablet (81 mg total) by mouth daily. What changed: when to take this    doxycycline 100 MG tablet Commonly known as: VIBRA-TABS Take 1 tablet (100 mg total) by mouth every 12 (twelve) hours.    ibuprofen 200 MG tablet Commonly known as: ADVIL Take 400 mg by mouth 2 (two) times daily as needed for moderate pain (pain score 4-6) or headache.    predniSONE 50 MG tablet Commonly known as: DELTASONE Take 1 tablet (50 mg total) by mouth daily with breakfast. Start taking on: December 05, 2023        History of Present Illness  02/08/2024  Pulmonary/ 1st office eval/ Waylynn Benefiel /  Office  No chief complaint on file.    Dyspnea:  *** Cough: *** Sleep: *** SABA use: *** 02: *** LDSCT:***  No obvious day to day or daytime pattern/variability or assoc excess/ purulent sputum or mucus plugs or hemoptysis or cp or chest tightness, subjective wheeze or overt sinus or hb symptoms.    Also denies any obvious fluctuation of symptoms with weather or environmental changes or other aggravating or alleviating factors except as outlined above   No unusual exposure hx  or h/o childhood pna/ asthma or knowledge of premature birth.  Current Allergies, Complete Past Medical History, Past Surgical History, Family History, and Social History were reviewed in [employer] record.  ROS  The following are not active complaints unless bolded Hoarseness, sore throat, dysphagia, dental problems, itching, sneezing,  nasal congestion or discharge of excess mucus or purulent secretions, ear ache,   fever, chills, sweats, unintended wt loss or wt gain, classically pleuritic or exertional cp,  orthopnea pnd or arm/hand swelling  or leg swelling, presyncope, palpitations, abdominal pain, anorexia, nausea, vomiting, diarrhea  or change in bowel habits or change in bladder habits, change in stools or change in urine, dysuria, hematuria,  rash, arthralgias, visual complaints, headache, numbness, weakness or ataxia or problems with walking or coordination,  change in mood or  memory.            Outpatient Medications Prior to Visit  Medication Sig Dispense Refill   albuterol (PROVENTIL HFA) 108 (90 Base) MCG/ACT inhaler INHALE 2 PUFFS BY MOUTH EVERY 6 HOURS AS NEEDED FOR COUGHING, WHEEZING, OR SHORTNESS OF BREATH 20.1 g 1   aspirin EC 81 MG EC tablet Take 1 tablet (81 mg total) by mouth daily. (Patient taking differently: Take 81 mg by mouth every evening.) 30 tablet 0   doxycycline (VIBRA-TABS) 100 MG tablet Take 1 tablet (100 mg total) by mouth every 12 (twelve) hours. 6 tablet 0   ibuprofen (ADVIL) 200 MG tablet Take 400 mg by mouth 2 (two) times daily as needed for moderate pain (pain score 4-6) or headache.     predniSONE (DELTASONE) 50 MG tablet Take 1 tablet (50 mg total) by mouth daily with breakfast. 3 tablet 0   No facility-administered medications prior to visit.    Past Medical History:  Diagnosis Date   Hypercholesterolemia    Tuberculosis       Objective:     There were no vitals taken for this visit.         Assessment   No  problem-specific Assessment & Plan notes found for this encounter.     Sandrea Hughs, MD 02/06/2024

## 2024-02-08 ENCOUNTER — Telehealth: Payer: Self-pay | Admitting: Internal Medicine

## 2024-02-08 ENCOUNTER — Institutional Professional Consult (permissible substitution): Payer: Commercial Managed Care - HMO | Admitting: Internal Medicine
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
## 2024-07-20 ENCOUNTER — Encounter (HOSPITAL_COMMUNITY): Payer: Self-pay | Admitting: Emergency Medicine

## 2024-07-20 ENCOUNTER — Emergency Department (HOSPITAL_COMMUNITY)

## 2024-07-20 ENCOUNTER — Other Ambulatory Visit: Payer: Self-pay

## 2024-07-20 ENCOUNTER — Emergency Department (HOSPITAL_COMMUNITY)
Admission: EM | Admit: 2024-07-20 | Discharge: 2024-07-20 | Disposition: A | Discharge status: Home / Self Care | Attending: Emergency Medicine | Admitting: Emergency Medicine

## 2024-07-20 DIAGNOSIS — J441 Chronic obstructive pulmonary disease with (acute) exacerbation: Secondary | ICD-10-CM | POA: Insufficient documentation

## 2024-07-20 DIAGNOSIS — Z7982 Long term (current) use of aspirin: Secondary | ICD-10-CM | POA: Diagnosis not present

## 2024-07-20 DIAGNOSIS — R0602 Shortness of breath: Secondary | ICD-10-CM | POA: Diagnosis present

## 2024-07-20 LAB — CBC
HCT: 39.8 % (ref 39.0–52.0)
Hemoglobin: 12.9 g/dL — ABNORMAL LOW (ref 13.0–17.0)
MCH: 31.2 pg (ref 26.0–34.0)
MCHC: 32.4 g/dL (ref 30.0–36.0)
MCV: 96.1 fL (ref 80.0–100.0)
Platelets: 319 K/uL (ref 150–400)
RBC: 4.14 MIL/uL — ABNORMAL LOW (ref 4.22–5.81)
RDW: 12.9 % (ref 11.5–15.5)
WBC: 8.4 K/uL (ref 4.0–10.5)
nRBC: 0 % (ref 0.0–0.2)

## 2024-07-20 LAB — BASIC METABOLIC PANEL WITH GFR
Anion gap: 9 (ref 5–15)
BUN: 14 mg/dL (ref 6–20)
CO2: 25 mmol/L (ref 22–32)
Calcium: 8.5 mg/dL — ABNORMAL LOW (ref 8.9–10.3)
Chloride: 101 mmol/L (ref 98–111)
Creatinine, Ser: 0.99 mg/dL (ref 0.61–1.24)
GFR, Estimated: >60 mL/min (ref >60)
Glucose, Bld: 102 mg/dL — ABNORMAL HIGH (ref 70–99)
Potassium: 3.5 mmol/L (ref 3.5–5.1)
Sodium: 135 mmol/L (ref 135–145)

## 2024-07-20 LAB — RESP PANEL BY RT-PCR (RSV, FLU A&B, COVID)  RVPGX2
Influenza A by PCR: NEGATIVE
Influenza B by PCR: NEGATIVE
Resp Syncytial Virus by PCR: NEGATIVE
SARS Coronavirus 2 by RT PCR: NEGATIVE

## 2024-07-20 MED ORDER — IPRATROPIUM-ALBUTEROL 0.5-2.5 (3) MG/3ML IN SOLN
3.0000 mL | Freq: Once | RESPIRATORY_TRACT | Status: AC
  Administered 2024-07-20: 3 mL via RESPIRATORY_TRACT
  Filled 2024-07-20: qty 3

## 2024-07-20 MED ORDER — PREDNISONE 10 MG PO TABS: 20.0000 mg | ORAL_TABLET | Freq: Every day | ORAL | 0 refills | Status: DC

## 2024-07-20 MED ORDER — ALBUTEROL SULFATE HFA 108 (90 BASE) MCG/ACT IN AERS
2.0000 | INHALATION_SPRAY | Freq: Once | RESPIRATORY_TRACT | Status: AC
  Administered 2024-07-20: 2 via RESPIRATORY_TRACT
  Filled 2024-07-20: qty 6.7

## 2024-07-20 MED ORDER — MAGNESIUM SULFATE 2 GM/50ML IV SOLN
2.0000 g | Freq: Once | INTRAVENOUS | Status: AC
  Administered 2024-07-20: 2 g via INTRAVENOUS
  Filled 2024-07-20: qty 50

## 2024-07-20 MED ORDER — DOXYCYCLINE HYCLATE 100 MG PO CAPS: 100.0000 mg | ORAL_CAPSULE | Freq: Two times a day (BID) | ORAL | 0 refills | Status: DC

## 2024-07-20 MED ORDER — ALBUTEROL SULFATE (2.5 MG/3ML) 0.083% IN NEBU
2.5000 mg | INHALATION_SOLUTION | Freq: Once | RESPIRATORY_TRACT | Status: AC
  Administered 2024-07-20: 2.5 mg via RESPIRATORY_TRACT
  Filled 2024-07-20: qty 3

## 2024-07-20 MED ORDER — METHYLPREDNISOLONE SODIUM SUCC 125 MG IJ SOLR
125.0000 mg | Freq: Once | INTRAMUSCULAR | Status: AC
  Administered 2024-07-20: 125 mg via INTRAVENOUS
  Filled 2024-07-20: qty 2

## 2024-07-20 NOTE — ED Notes (Signed)
 Pt ambulated around the unit w/ out oxygen. Pt sats remained 93%. Pt denies feeling SOB . EDP aware

## 2024-07-20 NOTE — Discharge Instructions (Signed)
 Follow-up with your family doctor next week.  Use your inhaler every 4-6 hours as needed and take it easy over the weekend.  Return here if any problems

## 2024-07-20 NOTE — ED Provider Notes (Signed)
 [place] EMERGENCY DEPARTMENT AT [facility] Provider Note   CSN: [id number] Arrival date & time: 07/20/24  [id number]     Patient presents with: Shortness of Breath   Adam Mercado is a 58 y.o. male.   Patient is a history of COPD.  Patient complains of shortness of breath and cough  The history is provided by the patient and medical records. No language interpreter was used.  Shortness of Breath Severity:  Moderate Onset quality:  Sudden Timing:  Constant Progression:  Waxing and waning Chronicity:  Recurrent Context: activity   Relieved by:  Nothing Worsened by:  Nothing Ineffective treatments:  None tried Associated symptoms: wheezing   Associated symptoms: no abdominal pain, no chest pain, no cough, no headaches and no rash   Risk factors: no recent alcohol use        Prior to Admission medications   Medication Sig Start Date End Date Taking? Authorizing Provider  doxycycline  (VIBRAMYCIN ) 100 MG capsule Take 1 capsule (100 mg total) by mouth 2 (two) times daily. One po bid x 7 days 07/20/24  Yes Mylie Mccurley, MD  predniSONE  (DELTASONE ) 10 MG tablet Take 2 tablets (20 mg total) by mouth daily. 07/20/24  Yes Suzette Pac, MD  albuterol  (PROVENTIL  HFA) 108 (90 Base) MCG/ACT inhaler INHALE 2 PUFFS BY MOUTH EVERY 6 HOURS AS NEEDED FOR COUGHING, WHEEZING, OR SHORTNESS OF BREATH 07/30/20   Comer Kirsch, PA-C  aspirin  EC 81 MG EC tablet Take 1 tablet (81 mg total) by mouth daily. Patient taking differently: Take 81 mg by mouth every evening. 08/25/16   Singh, Prashant K, MD  ibuprofen (ADVIL) 200 MG tablet Take 400 mg by mouth 2 (two) times daily as needed for moderate pain (pain score 4-6) or headache.    [provider]    Allergies: Patient has no known allergies.    Review of Systems  Constitutional:  Negative for appetite change and fatigue.  HENT:  Negative for congestion, ear discharge and sinus pressure.   Eyes:  Negative for discharge.   Respiratory:  Positive for shortness of breath and wheezing. Negative for cough.   Cardiovascular:  Negative for chest pain.  Gastrointestinal:  Negative for abdominal pain and diarrhea.  Genitourinary:  Negative for frequency and hematuria.  Musculoskeletal:  Negative for back pain.  Skin:  Negative for rash.  Neurological:  Negative for seizures and headaches.  Psychiatric/Behavioral:  Negative for hallucinations.     Updated Vital Signs BP 111/74   Pulse 71   Temp 98.3 F (36.8 C) (Oral)   Resp (!) 23   Ht 5' 7 (1.702 m)   Wt 53 kg   SpO2 94%   BMI 18.30 kg/m   Physical Exam Vitals and nursing note reviewed.  Constitutional:      Appearance: He is well-developed.  HENT:     Head: Normocephalic.     Nose: Nose normal.  Eyes:     General: No scleral icterus.    Conjunctiva/sclera: Conjunctivae normal.  Neck:     Thyroid: No thyromegaly.  Cardiovascular:     Rate and Rhythm: Normal rate and regular rhythm.     Heart sounds: No murmur heard.    No friction rub. No gallop.  Pulmonary:     Breath sounds: No stridor. No wheezing or rales.  Chest:     Chest wall: No tenderness.  Abdominal:     General: There is no distension.     Tenderness: There is no abdominal tenderness.  There is no rebound.  Musculoskeletal:        General: Normal range of motion.     Cervical back: Neck supple.  Lymphadenopathy:     Cervical: No cervical adenopathy.  Skin:    Findings: No erythema or rash.  Neurological:     Mental Status: He is alert and oriented to person, place, and time.     Motor: No abnormal muscle tone.     Coordination: Coordination normal.  Psychiatric:        Behavior: Behavior normal.     (all labs ordered are listed, but only abnormal results are displayed) Labs Reviewed  BASIC METABOLIC PANEL WITH GFR - Abnormal; Notable for the following components:      Result Value   Glucose, Bld 102 (*)    Calcium  8.5 (*)    All other components within normal  limits  CBC - Abnormal; Notable for the following components:   RBC 4.14 (*)    Hemoglobin 12.9 (*)    All other components within normal limits  RESP PANEL BY RT-PCR (RSV, FLU A&B, COVID)  [id number]    EKG: EKG Interpretation Date/Time:  Thursday July 20 2024 07:24:41 EDT Ventricular Rate:  93 PR Interval:  119 QRS Duration:  97 QT Interval:  356 QTC Calculation: 443 R Axis:   81  Text Interpretation: Sinus rhythm Borderline short PR interval Right atrial enlargement Minimal ST depression, inferior leads Confirmed by Suzette Pac ([id number]) on 07/20/2024 10:36:43 AM  Radiology: [facility] Chest Port 1 View Result Date: 07/20/2024 EXAM: 1 VIEW XRAY OF THE CHEST 07/20/2024 07:44:00 AM COMPARISON: 01/06/2024 CLINICAL HISTORY: 141880 SOB (shortness of breath) [id number]. Table formatting from the original note was not included.; Images from the original note were not included.; Per triage; Pt bib rcems for SOB that started last night. Pt w/ hx of COPD \\T \ asthma. Pt sats 88% on RA w/ EMS. Pt given 1 Duo neb and pt sats now 95% on RA. Pt bib rcems for SOB that started last night. Pt w/ hx of COPD \\T \ asthma. Pt sats 88% on RA w/ EMS. Pt given 1 Duo neb and pt sats now 95% on RA. FINDINGS: LUNGS AND PLEURA: Lungs are hyperinflated with diffuse coarsened interstitial markings compatible with COPD/emphysema. No superimposed airspace consolidation or interstitial edema. No pleural effusion. No pneumothorax. HEART AND MEDIASTINUM: Mild aortic atherosclerotic calcifications. No acute abnormality of the cardiac and mediastinal silhouettes. BONES AND SOFT TISSUES: No acute osseous abnormality. IMPRESSION: 1. Hyperinflated lungs with diffuse coarsened interstitial markings compatible with COPD/emphysema. 2. No superimposed airspace consolidation or interstitial edema. 3. No pleural effusion or pneumothorax. Electronically signed by: Waddell Calk MD 07/20/2024 07:56 AM EDT RP Workstation: [id number]     Procedures    Medications Ordered in the ED  methylPREDNISolone  sodium succinate (SOLU-MEDROL ) 125 mg/2 mL injection 125 mg (125 mg Intravenous Given 07/20/24 0801)  magnesium  sulfate IVPB 2 g 50 mL (0 g Intravenous Stopped 07/20/24 1006)  ipratropium-albuterol  (DUONEB) 0.5-2.5 (3) MG/3ML nebulizer solution 3 mL (3 mLs Nebulization Given 07/20/24 0749)  albuterol  (PROVENTIL ) (2.5 MG/3ML) 0.083% nebulizer solution 2.5 mg (2.5 mg Nebulization Given 07/20/24 0749)  albuterol  (VENTOLIN  HFA) 108 (90 Base) MCG/ACT inhaler 2 puff (2 puffs Inhalation Given 07/20/24 1038)                                    Medical Decision Making Amount and/or Complexity of Data  Reviewed Labs: ordered. Radiology: ordered.  Risk Prescription drug management.  Patient with COPD exacerbation.  Patient improved with neb treatments and magnesium  and steroids.  He will be discharged home with albuterol  inhaler prednisone  and doxycycline  follow-up with PCP     Final diagnoses:  COPD exacerbation [facility])    ED Discharge Orders          Ordered    predniSONE  (DELTASONE ) 10 MG tablet  Daily        07/20/24 1037    doxycycline  (VIBRAMYCIN ) 100 MG capsule  2 times daily        07/20/24 1037               Suzette Pac, MD 07/21/24 1501

## 2024-07-20 NOTE — ED Triage Notes (Signed)
 Pt bib rcems for SOB that started last night. Pt w/ hx of COPD & asthma. Pt sats 88% on RA w/ EMS. Pt given 1 Duo neb and pt sats now 95% on RA.

## 2024-08-24 NOTE — Progress Notes (Unsigned)
 Adam Mercado, male    DOB: [birth year]    MRN: [id number]   Brief patient profile:  58  yobm active smoker  referred to pulmonary clinic in [facility]  08/25/2024 by NP Rog[place]  for progressive doe/ cough x 2015    Pt not previously seen by PCCM service.    History of Present Illness  08/25/2024  Pulmonary/ 1st office eval/ Kavon Valenza / [facility] symbicort  2 bid Chief Complaint  Patient presents with   Establish Care   COPD    DOE   Dyspnea:  room to room bad / good day [employer] one aisle and rest  Cough: worse in am/ mucus variably dark mostly grey  Sleep: bed is flat/ 2 pillows  SABA use: up to twice daily  02: none   LDSCT:due 1st end of 2025   No obvious day to day or daytime pattern/variability or assoc e  mucus plugs or hemoptysis or cp or chest tightness, subjective wheeze or overt sinus or hb symptoms.    Also denies any obvious fluctuation of symptoms with weather or environmental changes or other aggravating or alleviating factors except as outlined above   No unusual exposure hx or h/o childhood pna/ asthma or knowledge of premature birth.  Current Allergies, Complete Past Medical History, Past Surgical History, Family History, and Social History were reviewed in [employer] record.  ROS  The following are not active complaints unless bolded Hoarseness, sore throat, dysphagia, dental problems, itching, sneezing,  nasal congestion or discharge of excess mucus or purulent secretions, ear ache,   fever, chills, sweats, unintended wt loss or wt gain, classically pleuritic or exertional cp,  orthopnea pnd or arm/hand swelling  or leg swelling, presyncope, palpitations, abdominal pain, anorexia, nausea, vomiting, diarrhea  or change in bowel habits or change in bladder habits, change in stools or change in urine, dysuria, hematuria,  rash, arthralgias, visual complaints, headache, numbness, weakness or ataxia or problems with walking or coordination,   change in mood or  memory.            Outpatient Medications Prior to Visit  Medication Sig Dispense Refill   albuterol  (PROVENTIL  HFA) 108 (90 Base) MCG/ACT inhaler INHALE 2 PUFFS BY MOUTH EVERY 6 HOURS AS NEEDED FOR COUGHING, WHEEZING, OR SHORTNESS OF BREATH 20.1 g 1   aspirin  EC 81 MG EC tablet Take 1 tablet (81 mg total) by mouth daily. (Patient taking differently: Take 81 mg by mouth every evening.) 30 tablet 0   doxycycline  (VIBRAMYCIN ) 100 MG capsule  Took about a month prior to OV    0   ibuprofen (ADVIL) 200 MG tablet Take 400 mg by mouth 2 (two) times daily as needed for moderate pain (pain score 4-6) or headache.     predniSONE  (DELTASONE ) 10 MG tablet Last about 1 month prior to OV          Past Medical History:  Diagnosis Date   Hypercholesterolemia    Tuberculosis       Objective:     BP (!) 147/84   Pulse 87   Ht 5' 7 (1.702 m)   Wt 117 lb 3.2 oz (53.2 kg)   SpO2 94% Comment: ra  BMI 18.36 kg/m   SpO2: 94 % (ra)    Wt Readings from Last 3 Encounters:  08/25/24 117 lb 3.2 oz (53.2 kg)  07/20/24 116 lb 13.5 oz (53 kg)  01/06/24 116 lb 13.5 oz (53 kg)    Amb thin  bm rattling cough   HEENT : Oropharynx  clear/ poor dentionin   Nasal turbinates nl    NECK :  without  apparent JVD/ palpable Nodes/TM    LUNGS: no acc muscle use,  Mild barrel  contour chest wall with bilateral  Distant insp/ exp rhonchi  and  without cough on insp or exp maneuvers  and mild  Hyperresonant  to  percussion bilaterally     CV:  RRR  no s3 or murmur or increase in P2, and no edema   ABD:  soft and nontender   MS:  Nl gait/ ext warm without deformities Or obvious joint restrictions  calf tenderness, cyanosis or clubbing     SKIN: warm and dry without lesions    NEURO:  alert, approp, nl sensorium with  no motor or cerebellar deficits apparent.        Assessment      Assessment & Plan COPD mixed type (HCC) COPD GOLD ? / emphysema on CT Active smoker - CT  12/02/23 diffuse moderate centrilobular emphysematous changes throughout bilateral lungs - 08/25/2024  After extensive coaching inhaler device,  effectiveness =    75%  DPI so given spiriva  12.5 sample and rx for spiriva  2.5 to add to symbicort  - 08/25/2024   Walked on RA  x  3  lap(s) =  approx 450  ft  @  mod pace, stopped due to sob with lowest 02 sats  87% and mild sob on lap 2   - PFTs 08/25/2024 >>>    Group E in terms of symptoms/risk so  laba/lama/ICS  therefore appropriate rx at this point >>>  symbiocrt 160 plus full strength spiriva  if tol   and approp SABA prn.  Re SABA :  I spent extra time with pt today reviewing appropriate use of albuterol  for prn use on exertion with the following points: 1) saba is for relief of sob that does not improve by walking a slower pace or resting but rather if the pt does not improve after trying this first. 2) If the pt is convinced, as many are, that saba helps recover from activity faster then it's easy to tell if this is the case by re-challenging : ie stop, take the inhaler, then p 5 minutes try the exact same activity (intensity of workload) that just caused the symptoms and see if they are substantially diminished or not after saba 3) if there is an activity that reproducibly causes the symptoms, try the saba 15 min before the activity on alternate days   If in fact the saba really does help, then fine to continue to use it prn but advised may need to look closer at the maintenance regimen being used to achieve better control of airways disease with exertion.   >>> return with pfts  and with all meds in hand using a trust but verify approach to confirm accurate Medication  Reconciliation The principal here is that until we are certain that the  patients are doing what we've asked, it makes no sense to ask them to do more.    Cigarette smoker Counseled re importance of smoking cessation but did not meet time criteria for separate billing    Low-dose  CT lung cancer screening is recommended for patients who are 58-68 years of age with a 20+ pack-year history of smoking and who are currently smoking or quit <=15 years ago. No coughing up blood  No unintentional weight loss of > 15 pounds in  the last 6 months - pt is eligible for scanning yearly until 15 years p quits > referred today    Each maintenance medication was reviewed in detail including emphasizing most importantly the difference between maintenance and prns and under what circumstances the prns are to be triggered using an action plan format where appropriate.  Total time for H and P, chart review, counseling, reviewing hfa/smi device(s) , directly observing portions of ambulatory 02 saturation study/ and generating customized AVS unique to this office visit / same day charting = 62 min new pt eval                 AVS  Patient Instructions  Plan A = Automatic = Always=    Symbicort  2 puffs 1st thing each am followed by spiriva  2 puffs each am and symbicort  2puffs 12 hours later   Plan B = Backup (to supplement plan A, not to replace it) Use your albuterol  inhaler as a rescue medication to be used if you can't catch your breath by resting or slowing your pace  or doing a relaxed purse lip breathing pattern.  - The less you use it, the better it will work when you need it. - Ok to use the inhaler up to 2 puffs  every 4 hours if you must but call for appointment if use goes up over your usual need - Don't leave home without it !!  (think of it like the spare tire or starter fluid for your car)      My office will be contacting you by phone for referral to lung cancer screening   [phone]) - if you don't hear back from my office within one week,  please call us  back or notify us  thru MyChart and we'll address it right away.    The key is to stop smoking completely before smoking completely stops you!   Follow up 3 months with pfts on return - Bring all inhalers with  you      Ozell America, MD 08/25/2024

## 2024-08-25 ENCOUNTER — Telehealth: Payer: Self-pay | Admitting: Internal Medicine

## 2024-08-25 ENCOUNTER — Encounter: Payer: Self-pay | Admitting: Internal Medicine

## 2024-08-25 ENCOUNTER — Ambulatory Visit: Admitting: Internal Medicine

## 2024-08-25 VITALS — BP 147/84 | HR 87 | Ht 67.0 in | Wt 117.2 lb

## 2024-08-25 DIAGNOSIS — F17210 Nicotine dependence, cigarettes, uncomplicated: Secondary | ICD-10-CM

## 2024-08-25 DIAGNOSIS — J449 Chronic obstructive pulmonary disease, unspecified: Secondary | ICD-10-CM | POA: Diagnosis not present

## 2024-08-25 MED ORDER — BUDESONIDE-FORMOTEROL FUMARATE 160-4.5 MCG/ACT IN AERO: INHALATION_SPRAY | RESPIRATORY_TRACT | 12 refills | Status: DC

## 2024-08-25 MED ORDER — TIOTROPIUM BROMIDE MONOHYDRATE 18 MCG IN CAPS: 18.0000 ug | ORAL_CAPSULE | Freq: Every day | RESPIRATORY_TRACT | Status: DC

## 2024-08-25 MED ORDER — SPIRIVA RESPIMAT 2.5 MCG/ACT IN AERS: INHALATION_SPRAY | RESPIRATORY_TRACT | 11 refills | Status: DC

## 2024-08-25 NOTE — Assessment & Plan Note (Addendum)
 COPD GOLD ? / emphysema on CT Active smoker - CT 12/02/23 diffuse moderate centrilobular emphysematous changes throughout bilateral lungs - 08/25/2024  After extensive coaching inhaler device,  effectiveness =    75%  DPI so given spiriva  12.5 sample and rx for spiriva  2.5 to add to symbicort  - 08/25/2024   Walked on RA  x  3  lap(s) =  approx 450  ft  @  mod pace, stopped due to sob with lowest 02 sats  87% and mild sob on lap 2   - PFTs 08/25/2024 >>>    Group E in terms of symptoms/risk so  laba/lama/ICS  therefore appropriate rx at this point >>>  symbiocrt 160 plus full strength spiriva  if tol   and approp SABA prn.  Re SABA :  I spent extra time with pt today reviewing appropriate use of albuterol  for prn use on exertion with the following points: 1) saba is for relief of sob that does not improve by walking a slower pace or resting but rather if the pt does not improve after trying this first. 2) If the pt is convinced, as many are, that saba helps recover from activity faster then it's easy to tell if this is the case by re-challenging : ie stop, take the inhaler, then p 5 minutes try the exact same activity (intensity of workload) that just caused the symptoms and see if they are substantially diminished or not after saba 3) if there is an activity that reproducibly causes the symptoms, try the saba 15 min before the activity on alternate days   If in fact the saba really does help, then fine to continue to use it prn but advised may need to look closer at the maintenance regimen being used to achieve better control of airways disease with exertion.   >>> return with pfts  and with all meds in hand using a trust but verify approach to confirm accurate Medication  Reconciliation The principal here is that until we are certain that the  patients are doing what we've asked, it makes no sense to ask them to do more.

## 2024-08-25 NOTE — Assessment & Plan Note (Addendum)
 Counseled re importance of smoking cessation but did not meet time criteria for separate billing    Low-dose CT lung cancer screening is recommended for patients who are 61-59 years of age with a 20+ pack-year history of smoking and who are currently smoking or quit <=15 years ago. No coughing up blood  No unintentional weight loss of > 15 pounds in the last 6 months - pt is eligible for scanning yearly until 15 years p quits > referred today    Each maintenance medication was reviewed in detail including emphasizing most importantly the difference between maintenance and prns and under what circumstances the prns are to be triggered using an action plan format where appropriate.  Total time for H and P, chart review, counseling, reviewing hfa/smi device(s) , directly observing portions of ambulatory 02 saturation study/ and generating customized AVS unique to this office visit / same day charting = 62 min new pt eval

## 2024-08-25 NOTE — Patient Instructions (Addendum)
 Plan A = Automatic = Always=    Symbicort  2 puffs 1st thing each am followed by spiriva  2 puffs each am and symbicort  2puffs 12 hours later   Plan B = Backup (to supplement plan A, not to replace it) Use your albuterol  inhaler as a rescue medication to be used if you can't catch your breath by resting or slowing your pace  or doing a relaxed purse lip breathing pattern.  - The less you use it, the better it will work when you need it. - Ok to use the inhaler up to 2 puffs  every 4 hours if you must but call for appointment if use goes up over your usual need - Don't leave home without it !!  (think of it like the spare tire or starter fluid for your car)      My office will be contacting you by phone for referral to lung cancer screening   [phone]) - if you don't hear back from my office within one week,  please call us  back or notify us  thru MyChart and we'll address it right away.    The key is to stop smoking completely before smoking completely stops you!   Follow up 3 months with pfts on return - Bring all inhalers with you

## 2024-08-25 NOTE — Telephone Encounter (Signed)
 Spoke with patient regarding the Thursday 11/23/24  8:30 am PFT scheduled at [facility]  time is 8:15 am at the 1st floor registration desk at [facility] for check in---follow up with Dr. Darlean is 11/23/24 at 9:45 am.  Will mail information to patient and he voiced his understanding

## 2024-10-15 NOTE — Consults (Signed)
 Adult Nutrition Assessment  Reason for visit: c/f malnutrition   Subjective Spoke to pt at bedside. Pt reports a fair appetite, but eating less at home due to increased ETOH intake x 2-3 weeks. He believes he has lost about 9 lbs. Pt is eating >50% now, but is agreeable to ONS to aid in wt gain.   Nutrition History/Home Feedings: Regular diet with increased ETOH intake and decreased po intake x 2-3 weeks.   Cultural Food Preferences: none  Food Insecurity: Not on file    Objective Diagnosis: Active Problems:   Acute hypercapnic respiratory failure (CMS/HCC v28)   Alcohol dependence with uncomplicated withdrawal (CMS/HCC v28)   Cocaine abuse  Recent Procedures: - Past Medical and Surgical History:  has a past medical history of COPD (chronic obstructive pulmonary disease) and Hyperlipidemia.  has no past surgical history on file.  Social History:  reports that he has been smoking cigarettes. He does not have any smokeless tobacco history on file. He reports current drug use. Drug: Marijuana. Allergies: Patient has no known allergies.  Current Diet Order: Regular Diet: Boost Plus; Alternate flavors; Twice daily with L/D Current Oral Intake: >50%  Intake/Output Summary (Last 24 hours) at 10/15/2024 1520 Last data filed at 10/15/2024 1312 Gross per 24 hour  Intake 1683 ml  Output 600 ml  Net 1083 ml   Scheduled Meds: . atorvastatin   20 mg Oral Nightly  . budesonide -formoteroL  HFA  2 puff Inhalation BID  . doxycycline   100 mg Oral BID  . enoxaparin   40 mg Subcutaneous Daily  . predniSONE   40 mg Oral Daily  . thiamine   100 mg Oral Daily  . tiotropium  18 mcg Inhalation Daily   Continuous Infusions: PRN Meds:.acetaminophen , albuterol , aluminum-magnesium  hydroxide-simethicone, hydrOXYzine, melatonin, [DISCONTINUED] LORazepam  **OR** PHENobarbital, [DISCONTINUED] LORazepam  **OR** PHENobarbital, polyethylene glycol, promethazine  Results from last 7 days  Lab Units 10/13/24 1147   SODIUM mmol/L 138  POTASSIUM mmol/L 4.1  CHLORIDE mmol/L 96*  CO2 mmol/L 35*  BUN mg/dL 10  CREATININE mg/dL 9.16  GLUCOSE, RANDOM mg/dL 88  CALCIUM , TOTAL mg/dL 8.9  OSMOLALITY (CALCULATED) mOsm/kg 274  MAGNESIUM  mg/dL 1.7  AST IU/L 16  ALT IU/L 8  ALBUMIN g/dL 3.9  BILIRUBIN, TOTAL mg/dL 1.0  ALKALINE PHOSPHATASE IU/L 65  PROTEIN, TOTAL g/dL 6.5  ALBUMIN/GLOBULIN RATIO  1.5   POC BGs:   Recent Labs    10/13/24 1520  GLUWB 92    HP:Ijuz of Last BM: PTA Edema (per flow sheet): none Skin: intact Nutrition Support Access: None  Nutrition Focused Physical Exam: Completed by JJ on 11/09.   Subcutaneous fat loss:   Orbital: Moderate Triceps:Severe Thoracic/Lumbar:Unable or difficult to assess Muscle Loss:  Temporalis: Mild Clavicle:Mild Acromion/Deltoid: Moderate   Scapular: Unable or difficult to assess  Dorsal Hand:Moderate   Patellar/Thigh: Severe  Calf: Severe    Anthropometrics: Height: 1.778 m (5' 10)  Admission weight: 51.4 kg Current weight 51.4 kg (113 lb 5.1 oz) 11/07 Usual body weight: 125 lbs IBW: 75.5 kg Hamwii equation     Body mass index is 16.26 kg/m. BMI Classification: Underweight <18.5 Weight History: none  Estimated Daily Nutrition Needs  Energy: 8111-7734 kcal (25-30 kcal/kg) IBW Protein: 75-91 g (1.0-1.2 g/kg) IBW Fluid: 78ml/kcal  Assessment Adam Mercado is a 58 y.o. male admitted for respiratory failure, mild COPD exacerbation. PMHX includes severe COPD, HLD, alcohol use disorder, marijuana abuse, tobacco abuse. BMI indicates underweight. Po intake decreased x 2-3 weeks due to ETOH intake. No wt hx in EMR.  Moderate to severe SQ fat wasting mild to severe muscle mass wasting observed. Will add ONS.   Nutrition Diagnosis: (NI-5.2) Malnutrition; see statement below   Recommended Malnutrition Diagnosis per AND/ASPEN Clinical Characteristics: Severe malnutrition in the context of social/environmental circumstances identified by severe SQ fat  and muscle mass wasting.   Plan  1) Encourage PO intakes on current diet 2) Add ONS, Boost Plus, 2 times a day, to provide additional protein and calories daily. 3) Assist with menus as able. 4) Agree with daily Thiamine   Monitoring 1) Trend PO intakes and needs.  2) Labs and weights  3) POC  Goals: 1) Intake of  >50% consistently 2)  Acceptance of ONS 3)  Adherence to therapeutic prescribed diet 4) Electrolytes remain WNL 5) Achieve and maintain desirable weight   Discharge Plan: Regular diet with high calorie ONS of choice BID or as needed  Adam Mercado, RD LDN CNSC Available via [facility] Connect Oncologist on weekends)

## 2024-10-16 NOTE — Care Plan (Signed)
   10/16/24 1129  Potential DC Planning Needs  Brief Note Patient has insurance  DCP Eval Status Update Update  POTENTIAL DC PLANNING NEEDS Return to previous living situation  Resources for Discharge  Other information Pt's case was discussed in am Rounds, & as per MD, Pt is from [facility], & DCP is for Pt to be DC Home, with possible Home O2.  MD to order a Walk test. [facility] following case for [facility].  CM will continue to follow case for DCP.   Barnie Corrigan, MSW Case Manager, Social Worker [phone]

## 2024-10-16 NOTE — Care Plan (Signed)
  Problem: Safety Goal: Patient will be injury free during hospitalization Outcome: Progressing   Problem: Discharge Barriers Goal: Patient's discharge needs are met prior to discharge Outcome: Progressing   Problem: Knowledge Goal: Patient/family/caregiver demonstrates understanding of disease process, treatment plan, medications, and discharge instructions prior to discharge Outcome: Progressing Goal: Collaborate with patient/family/caregiver to identify patient specific goals during hospitalization Outcome: Progressing   Problem: Respiratory: Goal: Ability to maintain adequate ventilation will improve Outcome: Progressing Goal: Levels of oxygenation will improve Outcome: Progressing   Problem: Knowledge: Goal: Understanding of the causes for impaired gas exchange will improve prior to discharge Outcome: Progressing Goal: Ability to state behaviors to prevent impaired gas exchange will improve prior to discharge Outcome: Progressing   Problem: Skin Integrity R/T Nutrition Risk Goal: Skin integrity will be supported with appropriate nutrition. Description: o Consult Nutrition o Ask provider for prealbumin level if none within last 2 weeks o Encourage and assist with meals and hydration when appropriate Outcome: Progressing   Problem: Substance Use: Goal: Complications related to the disease process, condition or treatment will be avoided or minimized during hospitalization Outcome: Progressing

## 2024-10-16 NOTE — Care Plan (Signed)
 10/16/24: CM acknowledged the Consult. Consult was  automatically generated & Initial DCP  completed.  CM will follow case for DCP.

## 2024-10-17 NOTE — Discharge Summary (Signed)
 Discharge Summary  Date of Admission: 10/13/2024 Name:  Adam Mercado  Date of Discharge:  10/17/2024 DOB:  1966/10/26 Discharging Team:  Hospitalist Medicine MRN:  [id number] Consultants:  Psychiatry/Mental Health Primary Care Physician: Oneil Aleene Charleston, MD  Discharge Diagnoses:    Acute hypercapnic respiratory failure (CMS/HCC v28)   Alcohol dependence with uncomplicated withdrawal (CMS/HCC v28)   Cocaine abuse   Severe protein-calorie malnutrition      Discharge Medications     .    * albuterol  2.5 mg /3 mL (0.083 %) nebulizer solution Inhale 3 mL (1 vial)  by nebulization every 4 (four) hours as needed for wheezing or shortness of breath. Commonly known as: PROVENTIL    * albuterol  inhaler Inhale 2 puffs every 6 (six) hours as needed for wheezing or shortness of breath. Commonly known as: VENTOLIN  HFA, PROVENTIL  HFA   atorvastatin  20 MG tablet Take 1 tablet (20 mg total) by mouth nightly. Commonly known as: LIPITOR   budesonide -formoteroL  HFA 160-4.5 mcg/actuation inhaler Inhale 2 puffs 2 (two) times a day. Commonly known as: SYMBICORT    doxycycline  100 MG capsule Take 1 capsule (100 mg total) by mouth 2 (two) times a day for 2 days. Commonly known as: MONODOX    prednisone  20 MG tablet Take 2 tablets by mouth daily for 2 days. Commonly known as: DELTASONE    tiotropium bromide  2.5 mcg/actuation inhaler Inhale 2 puffs  daily. Commonly known as: SPIRIVA  RESPIMAT      * * There are duplicate medications prescribed to the patient          Other Discharge Instructions:  Disposition:  Home Follow Up:  PCP Activity:  As tolerated Diet:  Regular  Results:   Last Weight:  51.4 kg (113 lb 5.1 oz) Lab Results  Component Value Date   WBC 7.0 10/13/2024   HGB 13.8 10/13/2024   HCT 41 10/13/2024   PLT 259 10/13/2024   NA 138 10/13/2024   K 4.1 10/13/2024   CL 96 (L) 10/13/2024   CO2 35 (H) 10/13/2024   BUN 10 10/13/2024   CREAT 0.83 10/13/2024    GLU 88 10/13/2024   CA 8.9 10/13/2024   MG 1.7 10/13/2024   ALB 3.9 10/13/2024   TP 6.5 10/13/2024   ALT 8 10/13/2024   AST 16 10/13/2024   BILIT 1.0 10/13/2024   TSH 0.95 10/13/2024   Unresulted Labs     None      Radiology:  XR Chest Portable Result Date: 10/15/2024 IMPRESSION: Negative portable single view of the chest.  CTA Head and Neck With IV Contrast Result Date: 10/13/2024 IMPRESSION: No evidence of dissection, occlusion, aneurysm, or hemodynamically significant stenosis of the arteries in the head/neck.   CTA Chest Pulmonary Embolism With IV Contrast Result Date: 10/13/2024 IMPRESSION: 1.  No acute pulmonary embolism or acute airspace disease. 2.  Minimal coronary artery calcification.  ECG 12 lead; Result Date: 10/13/2024 Normal sinus rhythm Minimal voltage criteria for LVH, may be normal variant ( Sokolow-Lyon ) Borderline ECG No previous ECGs available Confirmed by WELLS, DAVID [phone]) on 10/13/2024 12:10:15 PM  History of Present Illness:    This 58 y.o. male with a history of severe COPD presents to ER from [facility] for sats 84% and dec LOC after getting 8 mg ativan  po. Patient presented to [facility] on 11/6 voluntary for treatment of severe cocaine dependence, using 1.5 grams per day for >45 years, marijauna use, daily ETOH use a pint to a fifth of hard liquor a day. He  smokes 2 cigarettes a day now, says more in the past, does not quantify. He says his last cocaine and alcohol were 2 days ago, denies tremors or DTs.   At baseline, he says he is SOB with walking more than 20-30 feet, has stop getting up a flight of stairs to catch his breath. He does not have home 02, saw a Pulmonologist 2 weeks ago who said he was borderline for 02 needs. Takes symbicort  and spiriva  at home. He thinks his COPD symptoms are worse in the last week, no f/c, no infectious contacts. COVID negative at [facility]. Says he thinks he may have been intubated once in the past.  Hospital Course:     58 year old  man with a history of severe COPD, HLD, alcohol use disorder, marijuana abuse, tobacco abuse presents with hypoxia   Chronic hypercapnic respiratory failure with acute hypoxic respiratory failure COPD with mild exacerbation Tobacco abuse, marijuana abuse AHRF POA with tachypnea, nonrebreather required in transport.  Has improved, no significant wheezes on exam.  Reports that he has decreased tobacco use to 1 cigarette daily.  Has been adherent to home inhalers.  Additional contribution from decreased respiratory drive after getting significant Ativan  at [facility].  Viral testing negative. Treated with nebs and steroids and improved to baseline. Saturating well on RA at rest but found to have 1L O2 requirement with ambulation. Will complete doxycycline  course and pred burst at discharge. Nebulizers and all needed medications provided at discharge.    Alcohol use disorder, polysubstance abuse No withdrawal noted or CIWA needs while admitted. Seen by [facility] and ultimately decided for outpatient plan of care.    Concern for malnutrition - RD consult   HLD -Home statin  Disposition Patient from [place]. Provided oxygen, all medications, nebulizers and transportation via bus to [place]. Patient confirmed he has someone who will pick him up at the bus station and take him home.  The patient was instructed on indications for seeking medical attention.  After physical examination and a review of our findings and recommendations, the patient was cleared for discharge.  I spent more than 30 minutes in the discharge of this patient.  Sheena Armida Haddock, MD  Portions of this record may have been dictated using voice recognition software.  Unintentional errors in spelling and vocabulary are possible and may sometimes remain uncorrected.  *Some images could not be shown.

## 2024-10-17 NOTE — Progress Notes (Signed)
 MHCM called [facility] director of business - Garr Bohr to schedule doc to doc for [employer], DO.

## 2024-10-17 NOTE — Progress Notes (Signed)
 Substance use and shelter resources added to patient's chart.

## 2024-10-17 NOTE — Care Plan (Addendum)
 Addendum @ 1:31 pm, on 10/17/24--[facility] called stating the Pt's Nebulizer will be delivered to Pt's house, along with his O2, since he is traveling via [employer].  Pt, & Floor RN informed.   10/17/24 1227  Potential DC Planning Needs  Brief Note Patient has insurance  DCP Eval Status Update Update  POTENTIAL DC PLANNING NEEDS Return to previous living situation;DME;Other (Comment) (Oxygen-1L; [facility]; Nebulizer.)  Patient Goals  Where would you like to go when you DC from the hospital? Home  If your preferred DC setting is not an option, what would be your 2nd preference? Home  What are your overall goals for DC? Return to previous living  Resources for Discharge  Information/Referral to Resources Provided:    Charity Meds  Other information Pt's case was coordinated with Treatment Team, & as per MD, Pt is medically ready for DC.  CM called [facility] & coordinated case in order for Pt's O2 & Nebulizer to be delivered to his room.  As per [facility], they will need a phone number in order to delivery Pt's Stationary O2.  CM met with Pt @ bedside & discussed DCP.  Pt called his friend Garrel [phone]), & his friend is in agreement to pick him up at [facility]-([address]  [place] [phone]), & for [facility] to call Garrel to set up O2 delivery (Stationary O2).  CM provided [facility] with Pt's friend's phone number.  [facility] reported they will deliver DME to Pt's room--[place] O2, & Nebulizer.  CM requested for [employer] [place]) to purchase Bus Ticket going to [place] [facility]: [employer] Bus leaves [employer] @ 3:45 pm----[place] [facility]--5:20 pm.  CM requested [employer].  CM informed Pt ot travel time, & informed him to call his friend  letting him know Bus arrival time.  CM provided Pt with a Winter Jacket, tee shirt, & completed Charity Form to assist w/copayment.  Pt is cleared by  Medical & Psychiatrist for DC.  Pt is also cleared by CM.  CM DCP Completed YES   Barnie Corrigan, MSW Case Manager, Social Worker [phone]

## 2024-10-17 NOTE — Care Plan (Signed)
 COPD Action Plan for Adam Mercado  10/17/24    [facility] (All campuses): [phone] Pulmonary follow up appointment: Patient to self-schedule      GREEN ZONE: I am doing well  I can do the usual activity and exercise level I have the usual amount of cough, phlegm and mucus I can sleep well at night My appetite is good  Medicine How much to take When to take it  Spiriva  Respimat     2 puffs Daily  Symbicort  HFA    2 puff with Spacer  Twice a day  If you have oxygen, use it as prescribed by your doctor. Continue your regular exercise and diet plan. At all times avoid cigarette smoke or any lung irritants. Brush teeth and rinse mouth after each treatment.  YELLOW ZONE: I am having a bad day or a COPD flare I am more breathless than usual. I have less energy for my daily activities. I have increased or thicker phlegm or mucus. I am using my quick relief medicine more often. My ankles look more swollen than usual. I am coughing more than usual. I feel like I have a "chest cold". I am not sleeping well because my symptoms wake me up. My appetite is not good. My medicine is not helping me.  Medicine How much to take When to take it  Albuterol  Nebulizer   1 nebulizer Every 4 hours as needed  Albuterol  MDI   2 puffs with Spacer  Every 6 hours as needed   Continue to take your daily medications from the GREEN ZONE: Take your quick-relief medicine as prescribed. Call your doctor immediately and let him/her know all the symptoms you are having so you can have a plan on what to do next. Continue to use your oxygen as prescribed. Get plenty of rest. Use pursed lip breathing to help with your shortness of breath.  RED ZONE: I need urgent medical care  I have severe shortness of breath even at rest. I am not able to do any activity because of my breathing. I am not able to sleep because of my breathing. I have a fever or shaking chills. I am  feeling confused or very drowsy. I have chest pain. I am coughing up blood.  Call 911 or seek medical attention right now

## 2024-10-24 ENCOUNTER — Ambulatory Visit: Payer: Self-pay | Admitting: Internal Medicine

## 2024-10-24 NOTE — Telephone Encounter (Signed)
 FYI Only or Action Required?: Action required by provider: update on patient condition.  Patient is followed in Pulmonology for COPD, last seen on 08/25/2024 by Darlean Ozell NOVAK, MD.  Called Nurse Triage reporting Shortness of Breath.  Symptoms began a week ago.  Interventions attempted: Prescription medications: prednisone , abx, Maintenance inhaler, Nebulizer treatments, and Home oxygen use.  Symptoms are: gradually worsening.  Triage Disposition: No disposition on file.  Patient/caregiver understands and will follow disposition?:   E2C2 Pulmonary Triage - Initial Assessment Questions "Chief Complaint (e.g., cough, sob, wheezing, fever, chills, sweat or additional symptoms) *Go to specific symptom protocol after initial questions. Difficulty Breathing  "How long have symptoms been present?" 8-9 days  Have you tested for COVID or Flu? Note: If not, ask patient if a home test can be taken. If so, instruct patient to call back for positive results. No  MEDICINES:   "Have you used any OTC meds to help with symptoms?" No If yes, ask "What medications?" na  "Have you used your inhalers/maintenance medication?" Yes If yes, "What medications?" Spiriva  and Nebulizer  If inhaler, ask "How many puffs and how often?" Note: Review instructions on medication in the chart. Daily inhaler, neb BID  OXYGEN: "Do you wear supplemental oxygen?" Yes If yes, "How many liters are you supposed to use?" 1L  "Do you monitor your oxygen levels?" No If yes, What is your reading (oxygen level) today? na  What is your usual oxygen saturation reading?  (Note: Pulmonary O2 sats should be 90% or greater) unsure   Copied from CRM #[id number]. Topic: Clinical - Red Word Triage >> Oct 24, 2024 12:33 PM Rilla B wrote: [facility] that prompted transfer to Nurse Triage: Diff Breathing, Elnor dark phlem, Believes lung  infected   ----------------------------------------------------------------------- From previous Reason for Contact - Scheduling: Patient/patient representative is calling to schedule an appointment. Refer to attachments for appointment information. Reason for Disposition  [1] MILD difficulty breathing (e.g., minimal/no SOB at rest, SOB with walking) AND [2] worse than when discharged from hospital  Answer Assessment - Initial Assessment Questions Patient was in Detox/Rehab in [place] and they gave him 2 pills and passed out. he came to inpatient wearing oxygen. Started on steroids and abx and discharged on home O2-1L and advised to wear 24hrs. Spiriva  and Nebulizer as well at DC  The steroids have helped a lot but still having lots of phlegm and feels like he gets SOB if he has a hard time getting it up.  Does not have a Pulse ox monitor or BP cuff. Denies dizziness, CP, Headache, or diaphoresis.  Offered same day or tomorrow appt- pt needs to arrange transport for OV. Asking is medication can be called into updated pharmacy as they can deliver. And if he can get help getting Pulse Ox to monitor his O2 at home  ED/UC/ EMS precautions advised 1. MAIN CONCERN OR SYMPTOM: What's the main symptom you're worried about? (e.g., breathing problem, cough, sputum change, wheezing) What question do you have?     Difficulty breathing  2. ONSET: When did the difficulty start?      8-9 days ago  3. BETTER-SAME-WORSE: Are you getting better, staying the same, or getting worse compared to the day you were discharged?     Breathing feels worse  4. DISCHARGE DATE: What date were you discharged from the hospital?     11/11 5. DISCHARGE DOCTOR: Who is the main doctor taking care of you now?     Wert  6. DISCHARGE APPOINTMENT: Have you scheduled a follow-up discharge appointment with your doctor?     12/18 7. DISCHARGE MEDICINES: Did the doctor who discharged you order any new medicines for you  to use? If yes, have you filled the prescription and started taking the medicine?      Nebulizer and O2, Spiriva , Symbicort   8. OXYGEN THERAPY:      1L home O2 9. MONITORING Do you monitor/measure your oxygen level or vital signs (e.g., yes, no, measurements are automatically sent to call center). Document current and normal values if available.       Doesn't have monitor 10. BREATHING DIFFICULTY: Are you having any trouble breathing? If Yes, ask: How bad is it?  (e.g., none, mild, moderate, severe)        Cannot get a deep breath due to phlegm  11. FEVER: Do you have a fever? If Yes, ask: What is your temperature, how was it measured, and when did it start?       Denies  12. SPUTUM: Describe the color of your sputum (phlegm). (clear, white, yellow, green, blood-tinged)       Green gray brown  13. OTHER SYMPTOMS: Do you have any other symptoms? (e.g., fever)       Cough 14. SMOKING: Do you smoke currently?       Cigarettes- cut back to 1 cig to last for 2-3days  Protocols used: COPD Post-Hospitalization Follow-up Call-A-AH

## 2024-10-26 NOTE — Telephone Encounter (Signed)
 Pt schedule for 12/18 with wert

## 2024-10-27 ENCOUNTER — Encounter (HOSPITAL_COMMUNITY): Payer: Self-pay

## 2024-10-27 ENCOUNTER — Emergency Department (HOSPITAL_COMMUNITY)

## 2024-10-27 ENCOUNTER — Other Ambulatory Visit: Payer: Self-pay

## 2024-10-27 ENCOUNTER — Other Ambulatory Visit (HOSPITAL_COMMUNITY): Payer: Self-pay

## 2024-10-27 ENCOUNTER — Telehealth (HOSPITAL_COMMUNITY): Payer: Self-pay

## 2024-10-27 ENCOUNTER — Inpatient Hospital Stay (HOSPITAL_COMMUNITY)
Admission: EM | Admit: 2024-10-27 | Discharge: 2024-10-29 | DRG: 189 | Disposition: A | Discharge status: Home / Self Care | Attending: Internal Medicine | Admitting: Internal Medicine

## 2024-10-27 DIAGNOSIS — Z8419 Family history of other disorders of kidney and ureter: Secondary | ICD-10-CM

## 2024-10-27 DIAGNOSIS — J984 Other disorders of lung: Secondary | ICD-10-CM | POA: Diagnosis present

## 2024-10-27 DIAGNOSIS — I2510 Atherosclerotic heart disease of native coronary artery without angina pectoris: Secondary | ICD-10-CM | POA: Diagnosis present

## 2024-10-27 DIAGNOSIS — F1010 Alcohol abuse, uncomplicated: Secondary | ICD-10-CM | POA: Diagnosis present

## 2024-10-27 DIAGNOSIS — F1410 Cocaine abuse, uncomplicated: Secondary | ICD-10-CM | POA: Diagnosis present

## 2024-10-27 DIAGNOSIS — E43 Unspecified severe protein-calorie malnutrition: Secondary | ICD-10-CM | POA: Diagnosis present

## 2024-10-27 DIAGNOSIS — E7800 Pure hypercholesterolemia, unspecified: Secondary | ICD-10-CM | POA: Diagnosis present

## 2024-10-27 DIAGNOSIS — J9621 Acute and chronic respiratory failure with hypoxia: Secondary | ICD-10-CM | POA: Diagnosis present

## 2024-10-27 DIAGNOSIS — Z681 Body mass index (BMI) 19 or less, adult: Secondary | ICD-10-CM

## 2024-10-27 DIAGNOSIS — Z833 Family history of diabetes mellitus: Secondary | ICD-10-CM

## 2024-10-27 DIAGNOSIS — J9622 Acute and chronic respiratory failure with hypercapnia: Principal | ICD-10-CM | POA: Diagnosis present

## 2024-10-27 DIAGNOSIS — I10 Essential (primary) hypertension: Secondary | ICD-10-CM | POA: Diagnosis present

## 2024-10-27 DIAGNOSIS — J441 Chronic obstructive pulmonary disease with (acute) exacerbation: Secondary | ICD-10-CM | POA: Diagnosis present

## 2024-10-27 DIAGNOSIS — D649 Anemia, unspecified: Secondary | ICD-10-CM | POA: Diagnosis present

## 2024-10-27 DIAGNOSIS — F17210 Nicotine dependence, cigarettes, uncomplicated: Secondary | ICD-10-CM | POA: Diagnosis present

## 2024-10-27 DIAGNOSIS — Z7951 Long term (current) use of inhaled steroids: Secondary | ICD-10-CM

## 2024-10-27 DIAGNOSIS — Z823 Family history of stroke: Secondary | ICD-10-CM

## 2024-10-27 DIAGNOSIS — Z8249 Family history of ischemic heart disease and other diseases of the circulatory system: Secondary | ICD-10-CM | POA: Diagnosis not present

## 2024-10-27 DIAGNOSIS — F1210 Cannabis abuse, uncomplicated: Secondary | ICD-10-CM | POA: Diagnosis present

## 2024-10-27 HISTORY — DX: Hyperlipidemia, unspecified: E78.5

## 2024-10-27 HISTORY — DX: Chronic obstructive pulmonary disease, unspecified: J44.9

## 2024-10-27 HISTORY — DX: Alcohol abuse, uncomplicated: F10.10

## 2024-10-27 HISTORY — DX: Other psychoactive substance use, unspecified, uncomplicated: F19.90

## 2024-10-27 LAB — CBC WITH DIFFERENTIAL/PLATELET
Abs Immature Granulocytes: 0.04 K/uL (ref 0.00–0.07)
Basophils Absolute: 0.1 K/uL (ref 0.0–0.1)
Basophils Relative: 0 %
Eosinophils Absolute: 0.1 K/uL (ref 0.0–0.5)
Eosinophils Relative: 1 %
HCT: 43.4 % (ref 39.0–52.0)
Hemoglobin: 13.8 g/dL (ref 13.0–17.0)
Immature Granulocytes: 0 %
Lymphocytes Relative: 23 %
Lymphs Abs: 2.6 K/uL (ref 0.7–4.0)
MCH: 31.3 pg (ref 26.0–34.0)
MCHC: 31.8 g/dL (ref 30.0–36.0)
MCV: 98.4 fL (ref 80.0–100.0)
Monocytes Absolute: 0.6 K/uL (ref 0.1–1.0)
Monocytes Relative: 5 %
Neutro Abs: 8.1 K/uL — ABNORMAL HIGH (ref 1.7–7.7)
Neutrophils Relative %: 71 %
Platelets: 259 K/uL (ref 150–400)
RBC: 4.41 MIL/uL (ref 4.22–5.81)
RDW: 13.4 % (ref 11.5–15.5)
WBC: 11.4 K/uL — ABNORMAL HIGH (ref 4.0–10.5)
nRBC: 0 % (ref 0.0–0.2)

## 2024-10-27 LAB — BLOOD GAS, VENOUS
Acid-Base Excess: 5 mmol/L — ABNORMAL HIGH (ref 0.0–2.0)
Acid-Base Excess: 6.4 mmol/L — ABNORMAL HIGH (ref 0.0–2.0)
Acid-Base Excess: 7.7 mmol/L — ABNORMAL HIGH (ref 0.0–2.0)
Bicarbonate: 35.6 mmol/L — ABNORMAL HIGH (ref 20.0–28.0)
Bicarbonate: 37.3 mmol/L — ABNORMAL HIGH (ref 20.0–28.0)
Bicarbonate: 37.6 mmol/L — ABNORMAL HIGH (ref 20.0–28.0)
Drawn by: 38962
Drawn by: 27160
Drawn by: 27160
O2 Saturation: 47.1 %
O2 Saturation: 30.1 %
O2 Saturation: 41.4 %
Patient temperature: 36.3
Patient temperature: 36.3
Patient temperature: 36.3
pCO2, Ven: 83 mmHg (ref 44–60)
pCO2, Ven: 86 mmHg (ref 44–60)
pCO2, Ven: 78 mmHg (ref 44–60)
pH, Ven: 7.24 — ABNORMAL LOW (ref 7.25–7.43)
pH, Ven: 7.24 — ABNORMAL LOW (ref 7.25–7.43)
pH, Ven: 7.29 (ref 7.25–7.43)
pO2, Ven: <31 mmHg — CL (ref 32–45)
pO2, Ven: <31 mmHg — CL (ref 32–45)
pO2, Ven: <31 mmHg — CL (ref 32–45)

## 2024-10-27 LAB — COMPREHENSIVE METABOLIC PANEL WITH GFR
ALT: 39 U/L (ref 0–44)
AST: 33 U/L (ref 15–41)
Albumin: 4.6 g/dL (ref 3.5–5.0)
Alkaline Phosphatase: 90 U/L (ref 38–126)
Anion gap: 9 (ref 5–15)
BUN: 15 mg/dL (ref 6–20)
CO2: 32 mmol/L (ref 22–32)
Calcium: 9 mg/dL (ref 8.9–10.3)
Chloride: 100 mmol/L (ref 98–111)
Creatinine, Ser: 0.84 mg/dL (ref 0.61–1.24)
GFR, Estimated: >60 mL/min (ref >60)
Glucose, Bld: 101 mg/dL — ABNORMAL HIGH (ref 70–99)
Potassium: 4 mmol/L (ref 3.5–5.1)
Sodium: 140 mmol/L (ref 135–145)
Total Bilirubin: 0.5 mg/dL (ref 0.0–1.2)
Total Protein: 7.6 g/dL (ref 6.5–8.1)

## 2024-10-27 LAB — MRSA NEXT GEN BY PCR, NASAL: MRSA by PCR Next Gen: NOT DETECTED

## 2024-10-27 LAB — TROPONIN T, HIGH SENSITIVITY: Troponin T High Sensitivity: <15 ng/L (ref 0–19)

## 2024-10-27 LAB — PRO BRAIN NATRIURETIC PEPTIDE: Pro Brain Natriuretic Peptide: 229 pg/mL (ref <300.0)

## 2024-10-27 MED ORDER — METHYLPREDNISOLONE SODIUM SUCC 125 MG IJ SOLR
125.0000 mg | Freq: Once | INTRAMUSCULAR | Status: AC
  Administered 2024-10-27: 125 mg via INTRAVENOUS
  Filled 2024-10-27: qty 2

## 2024-10-27 MED ORDER — ALBUTEROL SULFATE (2.5 MG/3ML) 0.083% IN NEBU
INHALATION_SOLUTION | RESPIRATORY_TRACT | Status: AC
  Administered 2024-10-27: 2.5 mg
  Filled 2024-10-27: qty 12

## 2024-10-27 MED ORDER — FLUTICASONE FUROATE-VILANTEROL 100-25 MCG/ACT IN AEPB
1.0000 | INHALATION_SPRAY | Freq: Every day | RESPIRATORY_TRACT | Status: DC
  Administered 2024-10-27: 1 via RESPIRATORY_TRACT
  Filled 2024-10-27: qty 28

## 2024-10-27 MED ORDER — GUAIFENESIN-DM 100-10 MG/5ML PO SYRP: 5.0000 mL | ORAL_SOLUTION | ORAL | Status: DC | PRN

## 2024-10-27 MED ORDER — TIOTROPIUM BROMIDE MONOHYDRATE 18 MCG IN CAPS: 18.0000 ug | ORAL_CAPSULE | Freq: Every day | RESPIRATORY_TRACT | Status: DC

## 2024-10-27 MED ORDER — ENOXAPARIN SODIUM 40 MG/0.4ML IJ SOSY
40.0000 mg | PREFILLED_SYRINGE | INTRAMUSCULAR | Status: DC
  Administered 2024-10-27 – 2024-10-29 (×2): 40 mg via SUBCUTANEOUS
  Filled 2024-10-27 (×3): qty 0.4

## 2024-10-27 MED ORDER — ASPIRIN 81 MG PO TBEC
81.0000 mg | DELAYED_RELEASE_TABLET | Freq: Every evening | ORAL | Status: DC
  Administered 2024-10-27 – 2024-10-28 (×2): 81 mg via ORAL
  Filled 2024-10-27 (×2): qty 1

## 2024-10-27 MED ORDER — ALBUTEROL SULFATE (2.5 MG/3ML) 0.083% IN NEBU: 10.0000 mg/h | INHALATION_SOLUTION | RESPIRATORY_TRACT | Status: AC

## 2024-10-27 MED ORDER — ADULT MULTIVITAMIN W/MINERALS CH
1.0000 | ORAL_TABLET | Freq: Every day | ORAL | Status: DC
  Administered 2024-10-27 – 2024-10-29 (×3): 1 via ORAL
  Filled 2024-10-27 (×3): qty 1

## 2024-10-27 MED ORDER — IPRATROPIUM-ALBUTEROL 0.5-2.5 (3) MG/3ML IN SOLN
3.0000 mL | RESPIRATORY_TRACT | Status: DC
  Administered 2024-10-27: 3 mL via RESPIRATORY_TRACT
  Filled 2024-10-27: qty 3

## 2024-10-27 MED ORDER — METHYLPREDNISOLONE SODIUM SUCC 40 MG IJ SOLR
40.0000 mg | Freq: Two times a day (BID) | INTRAMUSCULAR | Status: DC
  Administered 2024-10-28: 40 mg via INTRAVENOUS
  Filled 2024-10-27: qty 1

## 2024-10-27 MED ORDER — THIAMINE HCL 100 MG/ML IJ SOLN: 100.0000 mg | Freq: Every day | INTRAMUSCULAR | Status: DC

## 2024-10-27 MED ORDER — ACETAMINOPHEN 500 MG PO TABS: 500.0000 mg | ORAL_TABLET | Freq: Four times a day (QID) | ORAL | Status: DC | PRN

## 2024-10-27 MED ORDER — THIAMINE MONONITRATE 100 MG PO TABS
100.0000 mg | ORAL_TABLET | Freq: Every day | ORAL | Status: DC
  Administered 2024-10-27 – 2024-10-29 (×3): 100 mg via ORAL
  Filled 2024-10-27 (×3): qty 1

## 2024-10-27 MED ORDER — CHLORHEXIDINE GLUCONATE CLOTH 2 % EX PADS
6.0000 | MEDICATED_PAD | Freq: Every day | CUTANEOUS | Status: DC
  Administered 2024-10-27 – 2024-10-28 (×2): 6 via TOPICAL

## 2024-10-27 MED ORDER — UMECLIDINIUM BROMIDE 62.5 MCG/ACT IN AEPB
1.0000 | INHALATION_SPRAY | Freq: Every day | RESPIRATORY_TRACT | Status: DC
  Administered 2024-10-27 – 2024-10-29 (×3): 1 via RESPIRATORY_TRACT
  Filled 2024-10-27: qty 7

## 2024-10-27 MED ORDER — MAGNESIUM SULFATE 2 GM/50ML IV SOLN
2.0000 g | Freq: Once | INTRAVENOUS | Status: AC
  Administered 2024-10-27: 2 g via INTRAVENOUS
  Filled 2024-10-27: qty 50

## 2024-10-27 MED ORDER — IPRATROPIUM-ALBUTEROL 0.5-2.5 (3) MG/3ML IN SOLN
3.0000 mL | RESPIRATORY_TRACT | Status: DC | PRN
Start: 2024-10-27 — End: 2024-10-29

## 2024-10-27 MED ORDER — LORAZEPAM 1 MG PO TABS: 1.0000 mg | ORAL_TABLET | ORAL | Status: DC | PRN

## 2024-10-27 MED ORDER — FOLIC ACID 1 MG PO TABS
1.0000 mg | ORAL_TABLET | Freq: Every day | ORAL | Status: DC
  Administered 2024-10-27 – 2024-10-29 (×3): 1 mg via ORAL
  Filled 2024-10-27 (×3): qty 1

## 2024-10-27 MED ORDER — IPRATROPIUM-ALBUTEROL 0.5-2.5 (3) MG/3ML IN SOLN
3.0000 mL | Freq: Four times a day (QID) | RESPIRATORY_TRACT | Status: DC
  Administered 2024-10-27 – 2024-10-28 (×4): 3 mL via RESPIRATORY_TRACT
  Filled 2024-10-27 (×4): qty 3

## 2024-10-27 MED ORDER — MELATONIN 3 MG PO TABS
6.0000 mg | ORAL_TABLET | Freq: Every evening | ORAL | Status: DC | PRN
  Administered 2024-10-27 – 2024-10-28 (×2): 6 mg via ORAL
  Filled 2024-10-27 (×2): qty 2

## 2024-10-27 MED ORDER — BUDESONIDE 0.25 MG/2ML IN SUSP
0.2500 mg | Freq: Two times a day (BID) | RESPIRATORY_TRACT | Status: DC
  Administered 2024-10-27 – 2024-10-29 (×4): 0.25 mg via RESPIRATORY_TRACT
  Filled 2024-10-27 (×4): qty 2

## 2024-10-27 MED ORDER — POLYETHYLENE GLYCOL 3350 17 G PO PACK: 17.0000 g | PACK | Freq: Every day | ORAL | Status: DC | PRN

## 2024-10-27 MED ORDER — SODIUM CHLORIDE 0.9 % IV SOLN
500.0000 mg | INTRAVENOUS | Status: DC
  Administered 2024-10-27 – 2024-10-29 (×3): 500 mg via INTRAVENOUS
  Filled 2024-10-27 (×3): qty 5

## 2024-10-27 MED ORDER — METHYLPREDNISOLONE SODIUM SUCC 40 MG IJ SOLR: 40.0000 mg | Freq: Every day | INTRAMUSCULAR | Status: DC

## 2024-10-27 MED ORDER — PROCHLORPERAZINE EDISYLATE 10 MG/2ML IJ SOLN: 5.0000 mg | Freq: Four times a day (QID) | INTRAMUSCULAR | Status: DC | PRN

## 2024-10-27 MED ORDER — ENSURE PLUS HIGH PROTEIN PO LIQD
237.0000 mL | Freq: Three times a day (TID) | ORAL | Status: DC
  Administered 2024-10-27 – 2024-10-29 (×6): 237 mL via ORAL

## 2024-10-27 MED ORDER — LORAZEPAM 2 MG/ML IJ SOLN: 1.0000 mg | INTRAMUSCULAR | Status: DC | PRN

## 2024-10-27 NOTE — H&P (Signed)
 History and Physical  Adam Mercado FMW:[id number] DOB: 05-24-1966 DOA: 10/27/2024  Referring physician: Dr. Geroldine, EDP  PCP: Benjamin Raina Elizabeth, NP  Outpatient Specialists: Pulmonary. Patient coming from: Home.  Chief Complaint: Shortness of breath.  HPI: Adam Mercado is a 58 y.o. male with medical history significant for COPD, current polysubstance abuse including tobacco, alcohol, marijuana, and cocaine, who presents to the ER due to worsening shortness of breath for the past 1 week.  Recently released from alcohol rehab facility about a week ago.  Since then, he has relapsed on alcohol use.  Admits to drinking a pint of liquor last night, as well as using cocaine and marijuana in the past 24 hours.  He smokes and would like to quit smoking since he knows it is not good for him.  Denies having any chest pain or palpitations.  EMS was activated.  Upon EMS arrival, the patient was severely hypoxic with O2 saturation in the 50s on room air.  He was placed on 6 L nasal cannula with improvement to 95%.  In the ER, O2 saturation in the 70s on room air.  The patient was placed on 4 L nasal cannula to maintain his O2 saturation above 90%.  Venous blood gas revealed pH 7.24, pCO2 83, PO2 less than 31, bicarb 35.6.  The patient is alert and oriented x 3 with a serum bicarb of 32.  His hypercarbia is likely chronic.  His chest x-ray showed hyperinflated lungs with no infiltrates to suggest pneumonia.  He received continuous albuterol  nebulizer, IV magnesium , and IV Solu-Medrol  in the ER.  TRH, hospitalist service, was asked to admit for further management of acute COPD exacerbation and acute on chronic hypoxic and hypercarbic respiratory failure.  ED Course: Temperature 97.7.  BP 172/93, pulse 103, respiratory rate 26, O2 saturation 100% on 4 L nasal cannula.  Review of Systems: Review of systems as noted in the HPI. All other systems reviewed and are negative.   Past Medical History:   Diagnosis Date   Alcohol abuse    COPD (chronic obstructive pulmonary disease) (HCC)    Drug use    cocaine marijuana   Hypercholesterolemia    Hyperlipidemia    Tuberculosis    Past Surgical History:  Procedure Laterality Date   CARDIAC CATHETERIZATION N/A 08/24/2016   Procedure: Left Heart Cath and Coronary Angiography;  Surgeon: Peter M Jordan, MD;  Location: [facility] INVASIVE CV LAB;  Service: Cardiovascular;  Laterality: N/A;   FINGER SURGERY      Social History:  reports that he has been smoking cigarettes. He has a 15 pack-year smoking history. He has never used smokeless tobacco. He reports current alcohol use. He reports current drug use. Drugs: Cocaine and Marijuana.   No Known Allergies  Family History  Problem Relation Age of Onset   Hypertension Mother    Kidney disease Mother    Hypertension Father    Stroke Father    Stroke Sister    Diabetes Brother    Colon cancer Neg Hx    Colon polyps Neg Hx       Prior to Admission medications   Medication Sig Start Date End Date Taking? Authorizing Provider  albuterol  (PROVENTIL  HFA) 108 (90 Base) MCG/ACT inhaler INHALE 2 PUFFS BY MOUTH EVERY 6 HOURS AS NEEDED FOR COUGHING, WHEEZING, OR SHORTNESS OF BREATH 07/30/20   Comer Kirsch, PA-C  aspirin  EC 81 MG EC tablet Take 1 tablet (81 mg total) by mouth daily. Patient taking differently: Take 70  mg by mouth every evening. 08/25/16   Singh, Prashant K, MD  budesonide -formoterol  (SYMBICORT ) 160-4.5 MCG/ACT inhaler Take 2 puffs first thing in am and then another 2 puffs about 12 hours later. 08/25/24   Darlean Ozell NOVAK, MD  ibuprofen (ADVIL) 200 MG tablet Take 400 mg by mouth 2 (two) times daily as needed for moderate pain (pain score 4-6) or headache.    [provider]  tiotropium (SPIRIVA ) 18 MCG inhalation capsule Place 1 capsule (18 mcg total) into inhaler and inhale daily. 08/25/24   Darlean Ozell NOVAK, MD  Tiotropium Bromide  Monohydrate (SPIRIVA  RESPIMAT) 2.5 MCG/ACT  AERS 2 puffs each am 08/25/24   Darlean Ozell NOVAK, MD    Physical Exam: BP (!) 160/83   Pulse 93   Temp 97.7 F (36.5 C) (Oral)   Resp (!) 28   Ht 5' 7 (1.702 m)   Wt 53.2 kg   SpO2 93%   BMI 18.37 kg/m   General: 58 y.o. year-old male well developed well nourished in no acute distress.  Alert and oriented x3. Cardiovascular: Regular rate and rhythm with no rubs or gallops.  No thyromegaly or JVD noted.  No lower extremity edema. 2/4 pulses in all 4 extremities. Respiratory: Mild rales diffusely.  Poor inspiratory effort. Abdomen: Soft nontender nondistended with normal bowel sounds x4 quadrants. Muskuloskeletal: No cyanosis, clubbing or edema noted bilaterally Neuro: CN II-XII intact, strength, sensation, reflexes Skin: No ulcerative lesions noted or rashes Psychiatry: Judgement and insight appear normal. Mood is appropriate for condition and setting          Labs on Admission:  Basic Metabolic Panel: Recent Labs  Lab 10/27/24 0237  NA 140  K 4.0  CL 100  CO2 32  GLUCOSE 101*  BUN 15  CREATININE 0.84  CALCIUM  9.0   Liver Function Tests: Recent Labs  Lab 10/27/24 0237  AST 33  ALT 39  ALKPHOS 90  BILITOT 0.5  PROT 7.6  ALBUMIN 4.6   No results for input(s): LIPASE, AMYLASE in the last 168 hours. No results for input(s): AMMONIA in the last 168 hours. CBC: Recent Labs  Lab 10/27/24 0237  WBC 11.4*  NEUTROABS 8.1*  HGB 13.8  HCT 43.4  MCV 98.4  PLT 259   Cardiac Enzymes: No results for input(s): CKTOTAL, CKMB, CKMBINDEX, TROPONINI in the last 168 hours.  BNP (last 3 results) Recent Labs    12/02/23 1115  BNP 121.0*    ProBNP (last 3 results) Recent Labs    10/27/24 0237  PROBNP 229.0    CBG: No results for input(s): GLUCAP in the last 168 hours.  Radiological Exams on Admission: DG Chest Port 1 View Result Date: 10/27/2024 EXAM: 1 VIEW(S) XRAY OF THE CHEST 10/27/2024 02:56:00 AM COMPARISON: Chest Radiograph 07/20/24  CLINICAL HISTORY: shob FINDINGS: LUNGS AND PLEURA: No focal pulmonary opacity. No pleural effusion. No pneumothorax. Similar hyperinflated lungs with coarsened lung markings. HEART AND MEDIASTINUM: No acute abnormality of the cardiac and mediastinal silhouettes. BONES AND SOFT TISSUES: No acute osseous abnormality. IMPRESSION: 1. No acute process. 2. Similar findings of COPD/Emphysema. Electronically signed by: Gilmore Molt MD 10/27/2024 03:57 AM EST RP Workstation: [id number]    EKG: I independently viewed the EKG done and my findings are as followed: Sinus rhythm rate of 97.  Nonspecific ST changes.  QTc 500.  Assessment/Plan Present on Admission:  COPD exacerbation (HCC)  Acute exacerbation of chronic obstructive pulmonary disease (COPD) (HCC)  Principal Problem:   COPD exacerbation (HCC) Active  Problems:   Acute exacerbation of chronic obstructive pulmonary disease (COPD) (HCC)  Acute exacerbation of COPD, POA Current smoker Continue bronchodilator nebulizer Continue IV Solu-Medrol  Added IV azithromycin  Continue as needed antitussives Resume home COPD regimen. Incentive spirometer, early mobilization.  Acute on chronic hypoxic and hypercarbic respiratory failure secondary to the above Presented with acute on chronic hypoxia, worse than baseline At baseline on 1 L nasal cannula continuously Currently requiring 4 L to maintain O2 saturation above 90%. VBG: 7.24/83/<31, start BiPAP Follow repeat VBG  Polysubstance abuse including alcohol, tobacco, cocaine, THC. Polysubstance cessation counseling done at bedside Repetition of cessation counseling as needed. TOC consulted to provide resources for complete cessation of polysubstance use.  Moderate protein calorie malnutrition BMI 18 Moderate muscle mass loss. Encourage oral protein calorie intake  Alcohol abuse Endorses drinking 1 pint of liquor last night CIWA protocol Multivitamins, thiamine , and folic acid   supplements.  QTc prolongation Admission twelve-lead EKG with QTc of 500 Optimize magnesium  and potassium levels Avoid QTc prolonging agents. Monitor on telemetry   Critical care time: 55 minutes.   DVT prophylaxis: Subcu Lovenox  daily.  Code Status: Full code.  Family Communication: None at bedside.  Disposition Plan: Admitted to stepdown unit.  Consults called: None.  Admission status: Inpatient status.   Status is: Inpatient The patient requires at least 2 midnights for further evaluation and treatment of present condition.   Terry LOISE Hurst MD [facility] Pager [phone]  If 7PM-7AM, please contact night-coverage [email or web address] Password [email or web address]  10/27/2024, 4:18 AM

## 2024-10-27 NOTE — Progress Notes (Signed)
 Adam Mercado is a 58 y.o. male with medical history significant for COPD, current polysubstance abuse including tobacco, alcohol, marijuana, and cocaine, who presents to the ER due to worsening shortness of breath for the past 1 week.  Recently released from alcohol rehab facility about a week ago.  Since then, he has relapsed on alcohol use.  Admits to drinking a pint of liquor last night, as well as using cocaine and marijuana in the past 24 hours.  He smokes and would like to quit smoking since he knows it is not good for him.  Denies having any chest pain or palpitations.  EMS was activated.  Upon EMS arrival, the patient was severely hypoxic with O2 saturation in the 50s on room air.  He was placed on 6 L nasal cannula with improvement to 95%.  Patient was admitted for acute on chronic hypoxemic and hypercarbic respiratory failure secondary to acute COPD exacerbation and at baseline wears 1 L nasal cannula at home.  He was initially started on BiPAP and now has been weaned to nasal cannula and appears to be responding to IV steroids and breathing treatments.  Patient seen and evaluated at bedside and appears to be able to speak in full sentences with no increased work of breathing or somnolence noted.  He denies any specific complaints and will remain on medications as noted.  He has been admitted after midnight.  Total care time: 35 minutes.

## 2024-10-27 NOTE — Progress Notes (Signed)
 Date and time results received: 10/27/24 1010 (use smartphrase .now to insert current time)  Test: Venous blood gas Critical Value: pCO2 78, pO2 < 31  Name of Provider Notified: Dr. Maree  Orders Received? Or Actions Taken?: MD aware. Patient's breathing has improved significantly. Bipap at bedside, pt currently on nasal cannula at 4 L

## 2024-10-27 NOTE — ED Provider Notes (Signed)
 [place] EMERGENCY DEPARTMENT AT [facility] Provider Note   CSN: [id number] Arrival date & time: 10/27/24  0231     Patient presents with: Shortness of Breath   Adam Mercado is a 58 y.o. male.   Patient is a 58 year old male with past medical history of COPD, coronary artery disease, hypertension, hyperlipidemia, and cocaine abuse.  Patient presenting today with complaints of shortness of breath.  This has been ongoing for several days, then became worse this evening.  He describes some cough and congestion, but no fevers.  EMS was called and patient was found to have oxygen saturations of 52% initially.  He was placed on oxygen by nasal cannula, then transported here.       Prior to Admission medications   Medication Sig Start Date End Date Taking? Authorizing Provider  albuterol  (PROVENTIL  HFA) 108 (90 Base) MCG/ACT inhaler INHALE 2 PUFFS BY MOUTH EVERY 6 HOURS AS NEEDED FOR COUGHING, WHEEZING, OR SHORTNESS OF BREATH 07/30/20   Comer Kirsch, PA-C  aspirin  EC 81 MG EC tablet Take 1 tablet (81 mg total) by mouth daily. Patient taking differently: Take 81 mg by mouth every evening. 08/25/16   Singh, Prashant K, MD  budesonide -formoterol  (SYMBICORT ) 160-4.5 MCG/ACT inhaler Take 2 puffs first thing in am and then another 2 puffs about 12 hours later. 08/25/24   Darlean Ozell NOVAK, MD  ibuprofen (ADVIL) 200 MG tablet Take 400 mg by mouth 2 (two) times daily as needed for moderate pain (pain score 4-6) or headache.    [provider]  tiotropium (SPIRIVA ) 18 MCG inhalation capsule Place 1 capsule (18 mcg total) into inhaler and inhale daily. 08/25/24   Darlean Ozell NOVAK, MD  Tiotropium Bromide  Monohydrate (SPIRIVA  RESPIMAT) 2.5 MCG/ACT AERS 2 puffs each am 08/25/24   Wert, Michael B, MD    Allergies: Patient has no known allergies.    Review of Systems  All other systems reviewed and are negative.   Updated Vital Signs BP (!) 163/93 (BP Location: Left Arm)   Pulse  98   Temp (!) 97.4 F (36.3 C) (Oral)   Resp (!) 31   Ht 5' 7 (1.702 m)   Wt 53.2 kg   SpO2 95%   BMI 18.37 kg/m   Physical Exam Vitals and nursing note reviewed.  Constitutional:      General: He is not in acute distress.    Appearance: He is not diaphoretic.     Comments: Patient is awake and alert.  He is thin and appears chronically ill.  He is in mild to moderate respiratory distress.  HENT:     Head: Normocephalic and atraumatic.  Cardiovascular:     Rate and Rhythm: Normal rate and regular rhythm.     Heart sounds: No murmur heard.    No friction rub.  Pulmonary:     Effort: Tachypnea and accessory muscle usage present. No respiratory distress.     Breath sounds: Examination of the right-middle field reveals rhonchi. Examination of the left-middle field reveals rhonchi. Rhonchi present. No wheezing or rales.  Abdominal:     General: Bowel sounds are normal. There is no distension.     Palpations: Abdomen is soft.     Tenderness: There is no abdominal tenderness.  Musculoskeletal:        General: Normal range of motion.     Cervical back: Normal range of motion and neck supple.     Right lower leg: No tenderness. No edema.  Left lower leg: No tenderness. No edema.  Skin:    General: Skin is warm and dry.  Neurological:     Mental Status: He is alert and oriented to person, place, and time.     Coordination: Coordination normal.     (all labs ordered are listed, but only abnormal results are displayed) Labs Reviewed  CBC WITH DIFFERENTIAL/PLATELET  COMPREHENSIVE METABOLIC PANEL WITH GFR  PRO BRAIN NATRIURETIC PEPTIDE  BLOOD GAS, VENOUS  TROPONIN T, HIGH SENSITIVITY    EKG: EKG Interpretation Date/Time:  Friday October 27 2024 02:40:52 EST Ventricular Rate:  97 PR Interval:  108 QRS Duration:  97 QT Interval:  393 QTC Calculation: 500 R Axis:   81  Text Interpretation: Sinus rhythm Short PR interval Right atrial enlargement ST depr, consider  ischemia, inferior leads Borderline prolonged QT interval No significant change since 07/20/2024 Confirmed by Geroldine Berg ([id number]) on 10/27/2024 2:43:35 AM  Radiology: No results found.   Procedures   Medications Ordered in the ED  albuterol  (PROVENTIL ,VENTOLIN ) solution continuous neb (has no administration in time range)  methylPREDNISolone  sodium succinate (SOLU-MEDROL ) 125 mg/2 mL injection 125 mg (has no administration in time range)  magnesium  sulfate IVPB 2 g 50 mL (has no administration in time range)  albuterol  (PROVENTIL ) (2.5 MG/3ML) 0.083% nebulizer solution (2.5 mg  Given 10/27/24 0244)                                    Medical Decision Making Amount and/or Complexity of Data Reviewed Labs: ordered. Radiology: ordered.  Risk Prescription drug management.   Patient is a 58 year old male with history of COPD presenting with complaints of shortness of breath.  He was found significantly hypoxic by EMS and was placed on oxygen.  Patient arrives here with saturations in the mid 80s on 4 L nasal cannula.  Lung exam reveals poor air movement with some rhonchi bilaterally.  He is in mild to moderate respiratory distress.  Laboratory studies obtained including CBC, metabolic panel, BNP, and troponin.  All are basically unremarkable.  Venous blood gas reveals a pCO2 of 83.  Patient chest x-ray showing COPD findings, but nothing acute.  Patient has received albuterol  along with Solu-Medrol  and magnesium , but continues to require 4 L nasal cannula to maintain his saturations in the low 90s.  I feel as though admission is indicated.  I have spoken with the hospitalist who agrees to admit.  CRITICAL CARE Performed by: Berg Geroldine Total critical care time: 40 minutes Critical care time was exclusive of separately billable procedures and treating other patients. Critical care was necessary to treat or prevent imminent or life-threatening deterioration. Critical care was time  spent personally by me on the following activities: development of treatment plan with patient and/or surrogate as well as nursing, discussions with consultants, evaluation of patient's response to treatment, examination of patient, obtaining history from patient or surrogate, ordering and performing treatments and interventions, ordering and review of laboratory studies, ordering and review of radiographic studies, pulse oximetry and re-evaluation of patient's condition.      Final diagnoses:  None    ED Discharge Orders     None          Geroldine Berg, MD 10/27/24 [id number]

## 2024-10-27 NOTE — Progress Notes (Signed)
 Initial Nutrition Assessment  DOCUMENTATION CODES:   Severe malnutrition in context of social or environmental circumstances, Underweight  INTERVENTION:   Ensure Plus High Protein po TID, each supplement provides 350 kcal and 20 grams of protein  Magic cup TID with meals, each supplement provides 290 kcal and 9 grams of protein Liberalize diet to regular Continue MVI, thiamine , and folic acid  Recommend monitoring potassium, phosphorus, and magnesium  daily for at least 3 days, MD to replete as needed, as pt is at risk for refeeding syndrome given hx of alcohol abuse.  NUTRITION DIAGNOSIS:   Severe Malnutrition related to social / environmental circumstances (alcohol abuse) as evidenced by severe muscle depletion, severe fat depletion.  GOAL:   Patient will meet greater than or equal to 90% of their needs  MONITOR:   PO intake, Supplement acceptance  REASON FOR ASSESSMENT:   Rounds    ASSESSMENT:   58 yo male admitted with COPD exacerbation. PMH includes COPD, HLD, alcohol abuse, cocaine use, marijuana use.  Patient was recently released from an alcohol rehab facility; relapsed on alcohol use after returning home approximately one week ago. He reports decreased food intake d/t increased alcohol intake since returning home. He likes Ensure supplements.   Patient was requiring BiPAP on admission, but now on 3 L oxygen via nasal cannula.   Diet was advanced to heart healthy once respiratory status improved today.  Meal intakes: 100% of lunch today  Weight history reviewed. Patient is chronically underweight with BMI = 16.8. Weight was 51.4 kg (113 lbs) on recent admission to [facility]. Current weight 53.2 kg  (117 lbs). Usual weight 125 lbs per patient.   Labs reviewed.  Medications reviewed and include folic acid , solu-medrol , MVI with minerals, thiamine , mag sulfate x 1.  NUTRITION - FOCUSED PHYSICAL EXAM:  Flowsheet Row Most Recent Value  Orbital Region Moderate  depletion  Upper Arm Region Severe depletion  Thoracic and Lumbar Region Severe depletion  Buccal Region Moderate depletion  Temple Region Moderate depletion  Clavicle Bone Region Moderate depletion  Clavicle and Acromion Bone Region Moderate depletion  Scapular Bone Region Moderate depletion  Dorsal Hand Mild depletion  Patellar Region Severe depletion  Anterior Thigh Region Severe depletion  Posterior Calf Region Severe depletion  Edema (RD Assessment) None  Hair Reviewed  Eyes Reviewed  Mouth Reviewed  Skin Reviewed  Nails Reviewed    Diet Order:   Diet Order             Diet Heart Fluid consistency: Thin  Diet effective now                   EDUCATION NEEDS:   Not appropriate for education at this time  Skin:  Skin Assessment: Reviewed RN Assessment  Last BM:  unknown  Height:   Ht Readings from Last 1 Encounters:  10/27/24 5' 10 (1.778 m)    Weight:   Wt Readings from Last 1 Encounters:  10/27/24 53.2 kg    Ideal Body Weight:  75.5 kg  BMI:  Body mass index is 16.83 kg/m.  Estimated Nutritional Needs:   Kcal:  1800-2000  Protein:  80-95 gm  Fluid:  1.8-2 L   Suzen HUNT RD, LDN, CNSC Contact via secure chat. If unavailable, use group chat RD Inpatient.

## 2024-10-27 NOTE — TOC CM/SW Note (Signed)
 Transition of Care [facility]) - Inpatient Brief Assessment   Patient Details  Name: Adam Mercado MRN: [id number] Date of Birth: 11-03-1966  Transition of Care [facility]) CM/SW Contact:    Lucie Lunger, LCSWA Phone Number: 10/27/2024, 8:57 AM   Clinical Narrative: Transition of Care Department [facility]) has reviewed patient and no TOC needs have been identified at this time. We will continue to monitor patient advancement through interdiciplinary progression rounds. If new patient transition needs arise, please place a TOC consult.  Transition of Care Asessment: Insurance and Status: Insurance coverage has been reviewed Patient has primary care physician: Yes Home environment has been reviewed: From home Prior level of function:: Independent Prior/Current Home Services: No current home services Social Drivers of Health Review: SDOH reviewed no interventions necessary Readmission risk has been reviewed: Yes Transition of care needs: no transition of care needs at this time

## 2024-10-27 NOTE — ED Triage Notes (Signed)
 Rcems from home . Cc of SOB x1 week but worse today. Was 52% on room air on arrival placed on 6l [place] brought up to 95%. Used 2 puffs of his inhaler. Recently dc from [place] for detox but has used alcohol, cocaine, and marijuana in the last 24 hours.  Supposed to be on 1L [place] at home   74% on room air upon arrival.

## 2024-10-27 NOTE — Telephone Encounter (Signed)
 Pharmacy Patient Advocate Encounter  Insurance verification completed.    The patient is insured through [facility] MEDICAID.     Ran test claim for Incruse Ellipta  62.57mcg and the current 30 day co-pay is $4.   This test claim was processed through [employer]- copay amounts may vary at other pharmacies due to [employer], or as the patient moves through the different stages of their insurance plan.

## 2024-10-27 NOTE — Progress Notes (Signed)
 Approximately [phone] patient transported, on BiPAP, to ICU from ER without incident.

## 2024-10-27 NOTE — Plan of Care (Signed)

## 2024-10-27 NOTE — Plan of Care (Signed)
   Problem: Education: Goal: Knowledge of General Education information will improve Description Including pain rating scale, medication(s)/side effects and non-pharmacologic comfort measures Outcome: Progressing

## 2024-10-28 DIAGNOSIS — J441 Chronic obstructive pulmonary disease with (acute) exacerbation: Secondary | ICD-10-CM | POA: Diagnosis not present

## 2024-10-28 LAB — CBC
HCT: 35.6 % — ABNORMAL LOW (ref 39.0–52.0)
Hemoglobin: 11.4 g/dL — ABNORMAL LOW (ref 13.0–17.0)
MCH: 31.5 pg (ref 26.0–34.0)
MCHC: 32 g/dL (ref 30.0–36.0)
MCV: 98.3 fL (ref 80.0–100.0)
Platelets: 201 K/uL (ref 150–400)
RBC: 3.62 MIL/uL — ABNORMAL LOW (ref 4.22–5.81)
RDW: 13.7 % (ref 11.5–15.5)
WBC: 9.4 K/uL (ref 4.0–10.5)
nRBC: 0 % (ref 0.0–0.2)

## 2024-10-28 LAB — IRON AND TIBC
Iron: 138 ug/dL (ref 45–182)
Saturation Ratios: 32 % (ref 17.9–39.5)
TIBC: 426 ug/dL (ref 250–450)
UIBC: 288 ug/dL

## 2024-10-28 LAB — RETICULOCYTES
Immature Retic Fract: 7.9 % (ref 2.3–15.9)
RBC.: 3.95 MIL/uL — ABNORMAL LOW (ref 4.22–5.81)
Retic Count, Absolute: 27.3 K/uL (ref 19.0–186.0)
Retic Ct Pct: 0.7 % (ref 0.4–3.1)

## 2024-10-28 LAB — FOLATE: Folate: >20 ng/mL (ref >5.9)

## 2024-10-28 LAB — BLOOD GAS, VENOUS
Acid-Base Excess: 10.4 mmol/L — ABNORMAL HIGH (ref 0.0–2.0)
Bicarbonate: 37.4 mmol/L — ABNORMAL HIGH (ref 20.0–28.0)
Drawn by: 27160
O2 Saturation: 87.4 %
Patient temperature: 36.9
pCO2, Ven: 59 mmHg (ref 44–60)
pH, Ven: 7.41 (ref 7.25–7.43)
pO2, Ven: 53 mmHg — ABNORMAL HIGH (ref 32–45)

## 2024-10-28 LAB — BASIC METABOLIC PANEL WITH GFR
Anion gap: 5 (ref 5–15)
BUN: 19 mg/dL (ref 6–20)
CO2: 35 mmol/L — ABNORMAL HIGH (ref 22–32)
Calcium: 8.8 mg/dL — ABNORMAL LOW (ref 8.9–10.3)
Chloride: 100 mmol/L (ref 98–111)
Creatinine, Ser: 0.76 mg/dL (ref 0.61–1.24)
GFR, Estimated: >60 mL/min (ref >60)
Glucose, Bld: 81 mg/dL (ref 70–99)
Potassium: 3.9 mmol/L (ref 3.5–5.1)
Sodium: 140 mmol/L (ref 135–145)

## 2024-10-28 LAB — FERRITIN: Ferritin: 43 ng/mL (ref 24–336)

## 2024-10-28 LAB — MAGNESIUM: Magnesium: 2.1 mg/dL (ref 1.7–2.4)

## 2024-10-28 LAB — VITAMIN B12: Vitamin B-12: 297 pg/mL (ref 180–914)

## 2024-10-28 MED ORDER — PREDNISONE 20 MG PO TABS
40.0000 mg | ORAL_TABLET | Freq: Every day | ORAL | Status: DC
  Administered 2024-10-29: 40 mg via ORAL
  Filled 2024-10-28: qty 2

## 2024-10-28 NOTE — Progress Notes (Signed)
   10/28/24 0032  BiPAP/CPAP/SIPAP  $ Non-Invasive Ventilator  Non-Invasive Vent Subsequent  BiPAP/CPAP/SIPAP Pt Type Adult  BiPAP/CPAP/SIPAP SERVO  Mask Type Full face mask  Dentures removed? Not applicable  Mask Size Medium  Respiratory Rate 12 breaths/min  IPAP 12 cmH20  EPAP 5 cmH2O  Pressure Support 7 cmH20  PEEP 5 cmH20  FiO2 (%) 30 %  Minute Ventilation 6.6  Leak 20  Peak Inspiratory Pressure (PIP) 12  Tidal Volume (Vt) 552  Patient Home Machine No  Patient Home Mask No  Patient Home Tubing No  Auto Titrate No  Press High Alarm 25 cmH2O  Press Low Alarm 2 cmH2O  Device Plugged into RED Power Outlet Yes  BiPAP/CPAP /SiPAP Vitals  Pulse Rate 91  Resp 16  BP 113/67  SpO2 98 %  MEWS Score/Color  MEWS Score 0  MEWS Score Color Green   Patient placed on BIPAP for the night.

## 2024-10-28 NOTE — Progress Notes (Signed)
 PROGRESS NOTE    Adam Mercado  FMW:[id number] DOB: [birth year] DOA: 10/27/2024 PCP: Benjamin Raina Elizabeth, NP   Brief Narrative:    Adam Mercado is a 58 y.o. male with medical history significant for COPD, current polysubstance abuse including tobacco, alcohol, marijuana, and cocaine, who presents to the ER due to worsening shortness of breath for the past 1 week.  Recently released from alcohol rehab facility about a week ago.  Since then, he has relapsed on alcohol use.  Admits to drinking a pint of liquor last night, as well as using cocaine and marijuana in the past 24 hours.  He smokes and would like to quit smoking since he knows it is not good for him.  Denies having any chest pain or palpitations.  EMS was activated.  Upon EMS arrival, the patient was severely hypoxic with O2 saturation in the 50s on room air.  He was placed on 6 L nasal cannula with improvement to 95%.  Patient was admitted for acute on chronic hypoxemic and hypercarbic respiratory failure secondary to acute COPD exacerbation and at baseline wears 1 L nasal cannula at home.  He was initially started on BiPAP and now has been weaned to nasal cannula and appears to be responding to IV steroids and breathing treatments.  He is overall improving and back to his baseline 1 L nasal cannula requirement, but breathing is not quite back to baseline.  Okay to transfer to telemetry.  Assessment & Plan:   Principal Problem:   COPD exacerbation (HCC) Active Problems:   Acute exacerbation of chronic obstructive pulmonary disease (COPD) (HCC)  Assessment and Plan:   Acute on chronic hypoxemic respiratory failure secondary to acute exacerbation of COPD, POA-improved Acute hypercarbic respiratory failure-resolving Continue IV azithromycin  IV Solu-Medrol  to prednisone  Okay to transfer to telemetry Back to baseline 1 L nasal cannula Breathing treatments as needed Continue as needed antitussives Resume home COPD  regimen. Incentive spirometer, early mobilization.   Polysubstance abuse including alcohol, tobacco, cocaine, THC. Polysubstance cessation counseling done at bedside Repetition of cessation counseling as needed. Patient will follow-up with probation officer outpatient  Anemia Monitor CBC in a.m. Check anemia panel No overt bleeding noted   Moderate protein calorie malnutrition BMI 18 Moderate muscle mass loss. Encourage oral protein calorie intake   Alcohol abuse Endorses drinking 1 pint of liquor last night CIWA protocol with no significant findings Multivitamins, thiamine , and folic acid  supplements.   QTc prolongation Admission twelve-lead EKG with QTc of 500 Optimize magnesium  and potassium levels Avoid QTc prolonging agents. Monitor on telemetry    DVT prophylaxis: Lovenox  Code Status: Full Family Communication: None at bedside Disposition Plan:  Status is: Inpatient Remains inpatient appropriate because: Need for IV medications.   Consultants:  None  Procedures:  None  Antimicrobials:  Anti-infectives (From admission, onward)    Start     Dose/Rate Route Frequency Ordered Stop   10/27/24 0500  azithromycin  (ZITHROMAX ) 500 mg in sodium chloride  0.9 % 250 mL IVPB        500 mg 250 mL/hr over 60 Minutes Intravenous Every 24 hours 10/27/24 0416        Subjective: Patient seen and evaluated today with no new acute complaints or concerns. No acute concerns or events noted overnight.  Overall appears to be breathing better, but still with some mild wheezing and not back to baseline.  Weaned to 1 L nasal cannula.  No acute overnight events noted.  Objective: Vitals:   10/28/24 0700 10/28/24 0800  10/28/24 0818 10/28/24 0900  BP: (!) 143/72   (!) 157/86  Pulse: 74   100  Resp:   20   Temp:  98.5 F (36.9 C)    TempSrc:  Oral    SpO2: 99%   93%  Weight:      Height:        Intake/Output Summary (Last 24 hours) at 10/28/2024 1056 Last data filed at  10/28/2024 0908 Gross per 24 hour  Intake 480 ml  Output 950 ml  Net -470 ml   Filed Weights   10/27/24 0240  Weight: 53.2 kg    Examination:  General exam: Appears calm and comfortable  Respiratory system: Minimal wheezing. Respiratory effort normal. 1L [place] Cardiovascular system: S1 & S2 heard, RRR.  Gastrointestinal system: Abdomen is soft Central nervous system: Alert and awake Extremities: No edema Skin: No significant lesions noted Psychiatry: Flat affect.    Data Reviewed: I have personally reviewed following labs and imaging studies  CBC: Recent Labs  Lab 10/27/24 0237 10/28/24 0508  WBC 11.4* 9.4  NEUTROABS 8.1*  --   HGB 13.8 11.4*  HCT 43.4 35.6*  MCV 98.4 98.3  PLT 259 201   Basic Metabolic Panel: Recent Labs  Lab 10/27/24 0237 10/28/24 0508  NA 140 140  K 4.0 3.9  CL 100 100  CO2 32 35*  GLUCOSE 101* 81  BUN 15 19  CREATININE 0.84 0.76  CALCIUM  9.0 8.8*  MG  --  2.1   GFR: Estimated Creatinine Clearance: 74.8 mL/min (by C-G formula based on SCr of 0.76 mg/dL). Liver Function Tests: Recent Labs  Lab 10/27/24 0237  AST 33  ALT 39  ALKPHOS 90  BILITOT 0.5  PROT 7.6  ALBUMIN 4.6   No results for input(s): LIPASE, AMYLASE in the last 168 hours. No results for input(s): AMMONIA in the last 168 hours. Coagulation Profile: No results for input(s): INR, PROTIME in the last 168 hours. Cardiac Enzymes: No results for input(s): CKTOTAL, CKMB, CKMBINDEX, TROPONINI in the last 168 hours. BNP (last 3 results) Recent Labs    10/27/24 0237  PROBNP 229.0   HbA1C: No results for input(s): HGBA1C in the last 72 hours. CBG: No results for input(s): GLUCAP in the last 168 hours. Lipid Profile: No results for input(s): CHOL, HDL, LDLCALC, TRIG, CHOLHDL, LDLDIRECT in the last 72 hours. Thyroid Function Tests: No results for input(s): TSH, T4TOTAL, FREET4, T3FREE, THYROIDAB in the last 72 hours. Anemia  Panel: No results for input(s): VITAMINB12, FOLATE, FERRITIN, TIBC, IRON, RETICCTPCT in the last 72 hours. Sepsis Labs: No results for input(s): PROCALCITON, LATICACIDVEN in the last 168 hours.  Recent Results (from the past 240 hours)  MRSA Next Gen by PCR, Nasal     Status: None   Collection Time: 10/27/24  9:00 AM   Specimen: Nasal Mucosa; Nasal Swab  Result Value Ref Range Status   MRSA by PCR Next Gen NOT DETECTED NOT DETECTED Final    Comment: (NOTE) The GeneXpert MRSA Assay (FDA approved for NASAL specimens only), is one component of a comprehensive MRSA colonization surveillance program. It is not intended to diagnose MRSA infection nor to guide or monitor treatment for MRSA infections. Test performance is not FDA approved in patients less than 79 years old. Performed at [facility], [address], [place], [place] [address]          Radiology Studies: [facility] Chest [facility] 1 View Result Date: 10/27/2024 EXAM: 1 VIEW(S) XRAY OF THE CHEST 10/27/2024  02:56:00 AM COMPARISON: Chest Radiograph 07/20/24 CLINICAL HISTORY: shob FINDINGS: LUNGS AND PLEURA: No focal pulmonary opacity. No pleural effusion. No pneumothorax. Similar hyperinflated lungs with coarsened lung markings. HEART AND MEDIASTINUM: No acute abnormality of the cardiac and mediastinal silhouettes. BONES AND SOFT TISSUES: No acute osseous abnormality. IMPRESSION: 1. No acute process. 2. Similar findings of COPD/Emphysema. Electronically signed by: Gilmore Molt MD 10/27/2024 03:57 AM EST RP Workstation: [id number]        Scheduled Meds:  aspirin  EC  81 mg Oral QPM   budesonide  (PULMICORT ) nebulizer solution  0.25 mg Nebulization BID   Chlorhexidine  Gluconate Cloth  6 each Topical Q0600   enoxaparin  (LOVENOX ) injection  40 mg Subcutaneous Q24H   feeding supplement  237 mL Oral TID BM   folic acid   1 mg Oral Daily   multivitamin with minerals  1 tablet Oral Daily   [START ON 10/29/2024] predniSONE   40  mg Oral Q breakfast   thiamine   100 mg Oral Daily   Or   thiamine   100 mg Intravenous Daily   umeclidinium bromide   1 puff Inhalation Daily   Continuous Infusions:  azithromycin  500 mg (10/28/24 0427)     LOS: 1 day    Time spent: 55 minutes    Jillien Yakel D Maree, DO [facility]  If 7PM-7AM, please contact night-coverage [email or web address] 10/28/2024, 10:56 AM

## 2024-10-28 NOTE — TOC CM/SW Note (Signed)
 Transition of Care (TOC) CM/SW Note   CSW update by RN that pt is interested in housing resources. CSW provided pt with shelter list and community resource guide at bedside and explained pt can review and follow up with his preferences.

## 2024-10-29 DIAGNOSIS — E43 Unspecified severe protein-calorie malnutrition: Secondary | ICD-10-CM | POA: Insufficient documentation

## 2024-10-29 DIAGNOSIS — J441 Chronic obstructive pulmonary disease with (acute) exacerbation: Secondary | ICD-10-CM | POA: Diagnosis not present

## 2024-10-29 LAB — CBC
HCT: 38.1 % — ABNORMAL LOW (ref 39.0–52.0)
Hemoglobin: 12.1 g/dL — ABNORMAL LOW (ref 13.0–17.0)
MCH: 31.3 pg (ref 26.0–34.0)
MCHC: 31.8 g/dL (ref 30.0–36.0)
MCV: 98.4 fL (ref 80.0–100.0)
Platelets: 232 K/uL (ref 150–400)
RBC: 3.87 MIL/uL — ABNORMAL LOW (ref 4.22–5.81)
RDW: 13.8 % (ref 11.5–15.5)
WBC: 10.3 K/uL (ref 4.0–10.5)
nRBC: 0 % (ref 0.0–0.2)

## 2024-10-29 LAB — BASIC METABOLIC PANEL WITH GFR
Anion gap: 7 (ref 5–15)
BUN: 17 mg/dL (ref 6–20)
CO2: 34 mmol/L — ABNORMAL HIGH (ref 22–32)
Calcium: 8.9 mg/dL (ref 8.9–10.3)
Chloride: 99 mmol/L (ref 98–111)
Creatinine, Ser: 0.72 mg/dL (ref 0.61–1.24)
GFR, Estimated: >60 mL/min (ref >60)
Glucose, Bld: 65 mg/dL — ABNORMAL LOW (ref 70–99)
Potassium: 3.8 mmol/L (ref 3.5–5.1)
Sodium: 140 mmol/L (ref 135–145)

## 2024-10-29 LAB — MAGNESIUM: Magnesium: 2 mg/dL (ref 1.7–2.4)

## 2024-10-29 MED ORDER — GUAIFENESIN-DM 100-10 MG/5ML PO SYRP: 5.0000 mL | ORAL_SOLUTION | ORAL | 0 refills | Status: AC | PRN

## 2024-10-29 MED ORDER — ENSURE PLUS HIGH PROTEIN PO LIQD: 237.0000 mL | Freq: Three times a day (TID) | ORAL | 0 refills | Status: AC

## 2024-10-29 MED ORDER — PREDNISONE 20 MG PO TABS: 40.0000 mg | ORAL_TABLET | Freq: Every day | ORAL | 0 refills | Status: AC

## 2024-10-29 NOTE — Plan of Care (Signed)

## 2024-10-29 NOTE — Discharge Summary (Signed)
 Physician Discharge Summary  Adam Mercado FMW:[id number] DOB: Apr 19, 1966 DOA: 10/27/2024  PCP: Benjamin Raina Elizabeth, NP  Admit date: 10/27/2024  Discharge date: 10/29/2024  Admitted From:Home  Disposition:  Home  Recommendations for Outpatient Follow-up:  Follow up with PCP in 1-2 weeks Remain on prednisone  as prescribed for 3 more days Continue home breathing treatments as prior Follow up with probation officer regarding substance abuse counseling Counseled on cessation of drugs  Home Health: None  Equipment/Devices: Has 1 L nasal cannula oxygen at home  Discharge Condition:Stable  CODE STATUS: Full  Diet recommendation: Heart Healthy  Brief/Interim Summary: Adam Mercado is a 58 y.o. male with medical history significant for COPD, current polysubstance abuse including tobacco, alcohol, marijuana, and cocaine, who presents to the ER due to worsening shortness of breath for the past 1 week.  Recently released from alcohol rehab facility about a week ago.  Since then, he has relapsed on alcohol use.  Admits to drinking a pint of liquor last night, as well as using cocaine and marijuana in the past 24 hours.  He smokes and would like to quit smoking since he knows it is not good for him.  Denies having any chest pain or palpitations.  EMS was activated.  Upon EMS arrival, the patient was severely hypoxic with O2 saturation in the 50s on room air.  He was placed on 6 L nasal cannula with improvement to 95%.  Patient was admitted for acute on chronic hypoxemic and hypercarbic respiratory failure secondary to acute COPD exacerbation and at baseline wears 1 L nasal cannula at home.  He was initially started on BiPAP and now has been weaned to nasal cannula and appears to be responding to IV steroids and breathing treatments.  On the day of discharge, he is back to his baseline 1 L nasal cannula and is otherwise doing quite well and ready for discharge.  He is on oral prednisone .  No  other acute events or concerns noted.  Discharge Diagnoses:  Principal Problem:   COPD exacerbation (HCC) Active Problems:   Acute exacerbation of chronic obstructive pulmonary disease (COPD) (HCC)   Protein-calorie malnutrition, severe  Principal discharge diagnosis: Acute on chronic hypoxemic respiratory failure secondary to acute COPD exacerbation along with acute hypercarbic respiratory failure.  Discharge Instructions  Discharge Instructions     Diet - low sodium heart healthy   Complete by: As directed    Increase activity slowly   Complete by: As directed       Allergies as of 10/29/2024   No Known Allergies      Medication List     TAKE these medications    albuterol  108 (90 Base) MCG/ACT inhaler Commonly known as: Proventil  HFA INHALE 2 PUFFS BY MOUTH EVERY 6 HOURS AS NEEDED FOR COUGHING, WHEEZING, OR SHORTNESS OF BREATH   aspirin  EC 81 MG tablet Take 1 tablet (81 mg total) by mouth daily.   atorvastatin  20 MG tablet Commonly known as: LIPITOR Take 20 mg by mouth daily.   budesonide -formoterol  160-4.5 MCG/ACT inhaler Commonly known as: Symbicort  Take 2 puffs first thing in am and then another 2 puffs about 12 hours later.   feeding supplement Liqd Take 237 mLs by mouth 3 (three) times daily between meals.   guaiFENesin -dextromethorphan  100-10 MG/5ML syrup Commonly known as: ROBITUSSIN DM Take 5 mLs by mouth every 4 (four) hours as needed for cough.   ibuprofen 200 MG tablet Commonly known as: ADVIL Take 400 mg by mouth 2 (two) times daily  as needed for moderate pain (pain score 4-6) or headache.   predniSONE  20 MG tablet Commonly known as: DELTASONE  Take 2 tablets (40 mg total) by mouth daily with breakfast for 3 days. Start taking on: October 30, 2024   Spiriva  Respimat 2.5 MCG/ACT Aers Generic drug: Tiotropium Bromide  2 puffs each am   tiotropium 18 MCG inhalation capsule Commonly known as: SPIRIVA  Place 1 capsule (18 mcg total) into  inhaler and inhale daily.   Tiotropium Bromide  2.5 MCG/ACT Aers Inhale 5 mcg into the lungs daily. 2 puffs daily        Follow-up Information     Nsumanganyi, [place] Cesar, NP. Schedule an appointment as soon as possible for a visit in 1 week(s).   Contact information: [address] [place] [address] [phone]                No Known Allergies  Consultations: None   Procedures/Studies: DG Chest Port 1 View Result Date: 10/27/2024 EXAM: 1 VIEW(S) XRAY OF THE CHEST 10/27/2024 02:56:00 AM COMPARISON: Chest Radiograph 07/20/24 CLINICAL HISTORY: shob FINDINGS: LUNGS AND PLEURA: No focal pulmonary opacity. No pleural effusion. No pneumothorax. Similar hyperinflated lungs with coarsened lung markings. HEART AND MEDIASTINUM: No acute abnormality of the cardiac and mediastinal silhouettes. BONES AND SOFT TISSUES: No acute osseous abnormality. IMPRESSION: 1. No acute process. 2. Similar findings of COPD/Emphysema. Electronically signed by: Gilmore Molt MD 10/27/2024 03:57 AM EST RP Workstation: [id number]     Discharge Exam: Vitals:   10/29/24 0800 10/29/24 0832  BP: (!) 142/81   Pulse: 81   Resp:    Temp:    SpO2:  95%   Vitals:   10/29/24 0149 10/29/24 0404 10/29/24 0800 10/29/24 0832  BP: 122/60 122/80 (!) 142/81   Pulse: 64 88 81   Resp:      Temp:  98.2 F (36.8 C)    TempSrc:  Oral    SpO2:  96%  95%  Weight:      Height:        General: Pt is alert, awake, not in acute distress Cardiovascular: RRR, S1/S2 +, no rubs, no gallops Respiratory: CTA bilaterally, no wheezing, no rhonchi, 1 L nasal cannula Abdominal: Soft, NT, ND, bowel sounds + Extremities: no edema, no cyanosis    The results of significant diagnostics from this hospitalization (including imaging, microbiology, ancillary and laboratory) are listed below for reference.     Microbiology: Recent Results (from the past 240 hours)  MRSA Next Gen by PCR, Nasal      Status: None   Collection Time: 10/27/24  9:00 AM   Specimen: Nasal Mucosa; Nasal Swab  Result Value Ref Range Status   MRSA by PCR Next Gen NOT DETECTED NOT DETECTED Final    Comment: (NOTE) The GeneXpert MRSA Assay (FDA approved for NASAL specimens only), is one component of a comprehensive MRSA colonization surveillance program. It is not intended to diagnose MRSA infection nor to guide or monitor treatment for MRSA infections. Test performance is not FDA approved in patients less than 17 years old. Performed at [facility], [address], [place], [place] [address]      Labs: BNP (last 3 results) Recent Labs    12/02/23 1115  BNP 121.0*   Basic Metabolic Panel: Recent Labs  Lab 10/27/24 0237 10/28/24 0508 10/29/24 0436  NA 140 140 140  K 4.0 3.9 3.8  CL 100 100 99  CO2 32 35* 34*  GLUCOSE 101* 81 65*  BUN 15 19 17   CREATININE 0.84 0.76 0.72  CALCIUM  9.0 8.8* 8.9  MG  --  2.1 2.0   Liver Function Tests: Recent Labs  Lab 10/27/24 0237  AST 33  ALT 39  ALKPHOS 90  BILITOT 0.5  PROT 7.6  ALBUMIN 4.6   No results for input(s): LIPASE, AMYLASE in the last 168 hours. No results for input(s): AMMONIA in the last 168 hours. CBC: Recent Labs  Lab 10/27/24 0237 10/28/24 0508 10/29/24 0436  WBC 11.4* 9.4 10.3  NEUTROABS 8.1*  --   --   HGB 13.8 11.4* 12.1*  HCT 43.4 35.6* 38.1*  MCV 98.4 98.3 98.4  PLT 259 201 232   Cardiac Enzymes: No results for input(s): CKTOTAL, CKMB, CKMBINDEX, TROPONINI in the last 168 hours. BNP: Invalid input(s): POCBNP CBG: No results for input(s): GLUCAP in the last 168 hours. D-Dimer No results for input(s): DDIMER in the last 72 hours. Hgb A1c No results for input(s): HGBA1C in the last 72 hours. Lipid Profile No results for input(s): CHOL, HDL, LDLCALC, TRIG, CHOLHDL, LDLDIRECT in the last 72 hours. Thyroid function studies No results for input(s): TSH, T4TOTAL, T3FREE,  THYROIDAB in the last 72 hours.  Invalid input(s): FREET3 Anemia work up Recent Labs    10/28/24 1137  VITAMINB12 297  FOLATE >20.0  FERRITIN 43  TIBC 426  IRON 138  RETICCTPCT 0.7   Urinalysis No results found for: COLORURINE, APPEARANCEUR, LABSPEC, PHURINE, GLUCOSEU, HGBUR, BILIRUBINUR, KETONESUR, PROTEINUR, UROBILINOGEN, NITRITE, LEUKOCYTESUR Sepsis Labs Recent Labs  Lab 10/27/24 0237 10/28/24 0508 10/29/24 0436  WBC 11.4* 9.4 10.3   Microbiology Recent Results (from the past 240 hours)  MRSA Next Gen by PCR, Nasal     Status: None   Collection Time: 10/27/24  9:00 AM   Specimen: Nasal Mucosa; Nasal Swab  Result Value Ref Range Status   MRSA by PCR Next Gen NOT DETECTED NOT DETECTED Final    Comment: (NOTE) The GeneXpert MRSA Assay (FDA approved for NASAL specimens only), is one component of a comprehensive MRSA colonization surveillance program. It is not intended to diagnose MRSA infection nor to guide or monitor treatment for MRSA infections. Test performance is not FDA approved in patients less than 38 years old. Performed at [facility], [address], [place], [place] [address]      Time coordinating discharge: 35 minutes  SIGNED:   Adron JONETTA Fairly, DO [facility] 10/29/2024, 11:45 AM  If 7PM-7AM, please contact night-coverage [email or web address]

## 2024-10-29 NOTE — Progress Notes (Signed)
 Patient's oxygen was decreased to 1L/min and O2 sats were 90-93% at rest. Patient ambulated about 100 ft. In hallway on 1L and sats decreased to 87% with increased shortness of breath. Patient now resting on 1L and O2 sats are 92%.

## 2024-11-13 ENCOUNTER — Encounter: Payer: Self-pay | Admitting: Emergency Medicine

## 2024-11-23 ENCOUNTER — Ambulatory Visit (HOSPITAL_COMMUNITY): Admission: RE | Admit: 2024-11-23 | Discharge: 2024-11-23 | Attending: Internal Medicine | Admitting: Internal Medicine

## 2024-11-23 ENCOUNTER — Ambulatory Visit: Admitting: Internal Medicine

## 2024-11-23 ENCOUNTER — Encounter: Payer: Self-pay | Admitting: Internal Medicine

## 2024-11-23 VITALS — BP 146/80 | HR 81 | Ht 70.0 in | Wt 118.6 lb

## 2024-11-23 DIAGNOSIS — J9611 Chronic respiratory failure with hypoxia: Secondary | ICD-10-CM | POA: Diagnosis not present

## 2024-11-23 DIAGNOSIS — F17210 Nicotine dependence, cigarettes, uncomplicated: Secondary | ICD-10-CM | POA: Diagnosis not present

## 2024-11-23 DIAGNOSIS — J9612 Chronic respiratory failure with hypercapnia: Secondary | ICD-10-CM

## 2024-11-23 DIAGNOSIS — J449 Chronic obstructive pulmonary disease, unspecified: Secondary | ICD-10-CM

## 2024-11-23 LAB — PULMONARY FUNCTION TEST
DL/VA % pred: 40 %
DL/VA: 1.73 ml/min/mmHg/L
DLCO cor % pred: 22 %
DLCO cor: 5.55 ml/min/mmHg
DLCO unc % pred: 20 %
DLCO unc: 5.11 ml/min/mmHg
FEF 25-75 Post: 0.23 L/s
FEF 25-75 Pre: 0.14 L/s
FEF2575-%Change-Post: 72 %
FEF2575-%Pred-Post: 8 %
FEF2575-%Pred-Pre: 4 %
FEV1-%Change-Post: 22 %
FEV1-%Pred-Post: 14 %
FEV1-%Pred-Pre: 11 %
FEV1-Post: 0.46 L
FEV1-Pre: 0.38 L
FEV1FVC-%Change-Post: -12 %
FEV1FVC-%Pred-Pre: 39 %
FEV6-%Change-Post: 32 %
FEV6-%Pred-Post: 32 %
FEV6-%Pred-Pre: 24 %
FEV6-Post: 1.33 L
FEV6-Pre: 1 L
FEV6FVC-%Change-Post: -5 %
FEV6FVC-%Pred-Post: 79 %
FEV6FVC-%Pred-Pre: 84 %
FVC-%Change-Post: 40 %
FVC-%Pred-Post: 40 %
FVC-%Pred-Pre: 29 %
FVC-Post: 1.76 L
FVC-Pre: 1.25 L
Post FEV1/FVC ratio: 26 %
Post FEV6/FVC ratio: 76 %
Pre FEV1/FVC ratio: 30 %
Pre FEV6/FVC Ratio: 80 %
RV % pred: 298 %
RV: 6.13 L
TLC % pred: 121 %
TLC: 7.74 L

## 2024-11-23 MED ORDER — BUDESONIDE-FORMOTEROL FUMARATE 160-4.5 MCG/ACT IN AERO: INHALATION_SPRAY | RESPIRATORY_TRACT | 12 refills | Status: AC

## 2024-11-23 MED ORDER — ALBUTEROL SULFATE HFA 108 (90 BASE) MCG/ACT IN AERS: INHALATION_SPRAY | RESPIRATORY_TRACT | 11 refills | Status: AC

## 2024-11-23 MED ORDER — ALBUTEROL SULFATE (2.5 MG/3ML) 0.083% IN NEBU
2.5000 mg | INHALATION_SOLUTION | Freq: Once | RESPIRATORY_TRACT | Status: AC
  Administered 2024-11-23: 09:00:00 2.5 mg via RESPIRATORY_TRACT

## 2024-11-23 MED ORDER — PREDNISONE 10 MG PO TABS: ORAL_TABLET | ORAL | 11 refills | Status: AC

## 2024-11-23 NOTE — Patient Instructions (Addendum)
 My office will be contacting you by phone for referral to lung cancer screening   [phone])   If don't hear from them in a week >>> Call the lung cancer screening program directly @  [phone] to discuss scheduling your low dose CT scan as soon as possible so you stay up to date.   Plan A = Automatic = Always=    symbicoft 160/ spiriva  2.5  each am 2 puffs each and then repeat symbicort  160 12 hours later   Work on inhaler technique:  relax and gently blow all the way out then take a nice smooth full deep breath back in, triggering the inhaler at same time you start breathing in.  Hold breath in for at least  5 seconds if you can. Blow out symbicort   thru nose. Rinse and gargle with water when done.  If mouth or throat bother you at all,  try brushing teeth/gums/tongue with arm and hammer toothpaste/ make a slurry and gargle and spit out.   >>>  Remember how golfers warm up by taking practice swings - do this with an empty inhaler   Plan B = Backup (to supplement plan A, not to replace it) Use your albuterol  inhaler as a rescue medication to be used if you can't catch your breath by resting or slowing your pace  or doing a relaxed purse lip breathing pattern.  - The less you use it, the better it will work when you need it. - Ok to use the inhaler up to 2 puffs  every 4 hours if you must but call for appointment if use goes up over your usual need - Don't leave home without it !!  (think of it like the spare tire or starter fluid for your car)   Plan C = Crisis (instead of Plan B but only if Plan B stops working) - only use your albuterol  nebulizer if you first try Plan B and it fails to help > ok to use the nebulizer up to every 4 hours but if start needing it regularly call for immediate appointment   Plan D = Deltasone  = prednisone   If ABC not working: Prednisone  10 mg take  4 each am x 2 days,   2 each am x 2 days,  1 each am x 2 days and stop (refillable)    Make sure you check  your oxygen saturation  AT  your highest level of activity (not after you stop)   to be sure it stays over 90% and adjust  02 flow upward to maintain this level if needed but remember to turn it back to previous settings when you stop (to conserve your supply).   Please see a dentist as soon as you can   . Please schedule a follow up visit in 3 months but call sooner if needed

## 2024-11-23 NOTE — Progress Notes (Signed)
 Adam Mercado, male    DOB: [birth year]    MRN: [id number]   Brief patient profile:  21  yobm active smoker  referred to pulmonary clinic in [facility]  08/25/2024 by NP Rogue  for progressive doe/ cough x 2015       History of Present Illness  08/25/2024  Pulmonary/ 1st office eval/ [facility] Zinn / [facility] symbicort  2 bid Chief Complaint  Patient presents with   Establish Care   COPD    DOE   Dyspnea:  room to room bad / good day [employer] one aisle and rest  Cough: worse in am/ mucus variably dark mostly grey  Sleep: bed is flat/ 2 pillows  SABA use: up to twice daily  02: none  LDSCT:due 1st end of 2025  Patient Instructions  Plan A = Automatic = Always=    Symbicort  2 puffs 1st thing each am followed by spiriva  2 puffs each am and symbicort  2puffs 12 hours later  Plan B = Backup (to supplement plan A, not to replace it) Use your albuterol  inhaler as a rescue medication   My office will be contacting you by phone for referral to lung cancer screening   [phone])> did not do The key is to stop smoking completely before smoking completely stops you!  Follow up 3 months with pfts on return - Bring all inhalers with you   11/23/2024  f/u ov/[place] office/[facility] re: GOLD 4/ 02 dep  maint on spiriva   did  bring inhalers needs LDSCT    Chief Complaint  Patient presents with   COPD    Pft @ [address]/cough(green/yellow)   Dyspnea:  mail box and back/ [employer] on 1lpm  Cough: usually worse in am  Sleeping: flat bed/ 2 pillows s   resp cc  SABA use: sev times a day more since ran out of symbiicort  02: 1lpm 24/7  Lung cancer screening: referred again   No obvious day to day or daytime variability or assoc excess/ purulent sputum or mucus plugs or hemoptysis or cp or chest tightness, subjective wheeze or overt sinus or hb symptoms.    Also denies any obvious fluctuation of symptoms with weather or environmental changes or other aggravating or alleviating factors except as  outlined above   No unusual exposure hx or h/o childhood pna/ asthma or knowledge of premature birth.  Current Allergies, Complete Past Medical History, Past Surgical History, Family History, and Social History were reviewed in [employer] record.  ROS  The following are not active complaints unless bolded Hoarseness, sore throat, dysphagia, dental problems, itching, sneezing,  nasal congestion or discharge of excess mucus or purulent secretions, ear ache,   fever, chills, sweats, unintended wt loss or wt gain, classically pleuritic or exertional cp,  orthopnea pnd or arm/hand swelling  or leg swelling, presyncope, palpitations, abdominal pain, anorexia, nausea, vomiting, diarrhea  or change in bowel habits or change in bladder habits, change in stools or change in urine, dysuria, hematuria,  rash, arthralgias, visual complaints, headache, numbness, weakness or ataxia or problems with walking or coordination,  change in mood or  memory.         Outpatient Medications Prior to Visit  Medication Sig Dispense Refill   albuterol  (PROVENTIL  HFA) 108 (90 Base) MCG/ACT inhaler INHALE 2 PUFFS BY MOUTH EVERY 6 HOURS AS NEEDED FOR COUGHING, WHEEZING, OR SHORTNESS OF BREATH 20.1 g 1   atorvastatin  (LIPITOR) 20 MG tablet Take 20 mg by mouth daily.  budesonide -formoterol  (SYMBICORT ) 160-4.5 MCG/ACT inhaler Take 2 puffs first thing in am and then another 2 puffs about 12 hours later. 1 each 12   feeding supplement (ENSURE PLUS HIGH PROTEIN) LIQD Take 237 mLs by mouth 3 (three) times daily between meals. 21330 mL 0   guaiFENesin -dextromethorphan  (ROBITUSSIN DM) 100-10 MG/5ML syrup Take 5 mLs by mouth every 4 (four) hours as needed for cough. 118 mL 0   ibuprofen (ADVIL) 200 MG tablet Take 400 mg by mouth 2 (two) times daily as needed for moderate pain (pain score 4-6) or headache.     tiotropium (SPIRIVA ) 18 MCG inhalation capsule Place 1 capsule (18 mcg total) into inhaler and inhale  daily.     Tiotropium Bromide  2.5 MCG/ACT AERS Inhale 5 mcg into the lungs daily. 2 puffs daily     Tiotropium Bromide  Monohydrate (SPIRIVA  RESPIMAT) 2.5 MCG/ACT AERS 2 puffs each am 1 each 11   aspirin  EC 81 MG EC tablet Take 1 tablet (81 mg total) by mouth daily. (Patient not taking: Reported on 11/23/2024) 30 tablet 0   No facility-administered medications prior to visit.         Past Medical History:  Diagnosis Date   Hypercholesterolemia    Tuberculosis       Objective:     Wts  11/23/2024     118 08/25/24 117 lb 3.2 oz (53.2 kg)  07/20/24 116 lb 13.5 oz (53 kg)  01/06/24 116 lb 13.5 oz (53 kg)    Vital signs reviewed  11/23/2024  - Note at rest 02 sats  91% on 1 lpm cont   General appearance:    amb somber wm nad   HEENT :  Oropharynx  clear/ very poor lower dentition    Nasal turbinates nl    NECK :  without JVD/Nodes/TM/ nl carotid upstrokes bilaterally   LUNGS: no acc muscle use,  Mod barrel  contour chest wall with bilateral  Distant bs s audible wheeze and  without cough on insp or exp maneuvers and mod  Hyperresonant  to  percussion bilaterally     CV:  RRR  no s3 or murmur or increase in P2, and no edema   ABD:  soft and nontender with pos mid insp Hoover's  in the supine position. No bruits or organomegaly appreciated, bowel sounds nl  MS:   Ext warm without deformities or   obvious joint restrictions , calf tenderness, cyanosis or clubbing  SKIN: warm and dry without lesions    NEURO:  alert, approp, nl sensorium with  no motor or cerebellar deficits apparent.              Assessment       Assessment & Plan COPD mixed type [facility]) Active smoker with GOLD 4/ group E criteria  - CT 12/02/23 diffuse moderate centrilobular emphysematous changes throughout bilateral lungs - 08/25/2024  After extensive coaching inhaler device,  effectiveness =    75%  DPI so given spiriva  12.5 sample and rx for spiriva  2.5 to add to symbicort  - 08/25/2024   Walked  on RA  x  3  lap(s) =  approx 450  ft  @  mod pace, stopped due to sob with lowest 02 sats  87% and mild sob on lap 2   - PFT's  11/23/2024   FEV1 0.46 (14 % ) ratio 0.26  p 22% response to saba and  nothing prior to study with DLCO  5.1 (20%)   and FV curve  classically concave    Very severe copd =  Group E in terms of symptoms/risk so  laba/lama/ICS  therefore appropriate rx at this point >>>  spiriva / symbicort  160   and approp SABA prn.  Re SABA :  I spent extra time with pt today reviewing appropriate use of albuterol  for prn use on exertion with the following points: 1) saba is for relief of sob that does not improve by walking a slower pace or resting but rather if the pt does not improve after trying this first. 2) If the pt is convinced, as many are, that saba helps recover from activity faster then it's easy to tell if this is the case by re-challenging : ie stop, take the inhaler, then p 5 minutes try the exact same activity (intensity of workload) that just caused the symptoms and see if they are substantially diminished or not after saba 3) if there is an activity that reproducibly causes the symptoms, try the saba 15 min before the activity on alternate days   If in fact the saba really does help, then fine to continue to use it prn but advised may need to look closer at the maintenance regimen being used to achieve better control of airways disease with exertion.     Cigarette smoker 4  min discussion re active cigarette smoking in addition to office E&M  Ask about tobacco use:   ongoing  Advise quitting   matter of life or breath talk given severity of copd  Assess willingness:  Not committed at this point Assist in quit attempt:  Per PCP when ready Arrange follow up:   Follow up per Primary Care planned      Chronic respiratory failure with hypoxia and hypercapnia (HCC) HC03   34     10/29/24  - 11/23/2024   Walked on 1lpm cont    x  3  lap(s) =  approx 450  ft  @ fast pace,  stopped due to end of study  with lowest 02 sats 80%    Advised: Make sure you check your oxygen saturation  AT  your highest level of activity (not after you stop)   to be sure it stays over 90% and adjust  02 flow upward to maintain this level if needed but remember to turn it back to previous settings when you stop (to conserve your supply).   F/u 3 m  Each maintenance medication was reviewed in detail including emphasizing most importantly the difference between maintenance and prns and under what circumstances the prns are to be triggered using an action plan format where appropriate.  Total time for H and P, chart review, counseling, reviewing hfa/smi/02/pulse ox device(s) , directly observing portions of ambulatory 02 saturation study/ and generating customized AVS unique to this office visit / same day charting = 35 min                 AVS  Patient Instructions  My office will be contacting you by phone for referral to lung cancer screening   [phone])   If don't hear from them in a week >>> Call the lung cancer screening program directly @  [phone] to discuss scheduling your low dose CT scan as soon as possible so you stay up to date.   Plan A = Automatic = Always=    symbicoft 160/ spiriva  2.5  each am 2 puffs each and then repeat symbicort  160 12 hours later   Work on  inhaler technique:  relax and gently blow all the way out then take a nice smooth full deep breath back in, triggering the inhaler at same time you start breathing in.  Hold breath in for at least  5 seconds if you can. Blow out symbicort   thru nose. Rinse and gargle with water when done.  If mouth or throat bother you at all,  try brushing teeth/gums/tongue with arm and hammer toothpaste/ make a slurry and gargle and spit out.   >>>  Remember how golfers warm up by taking practice swings - do this with an empty inhaler   Plan B = Backup (to supplement plan A, not to replace it) Use your albuterol   inhaler as a rescue medication to be used if you can't catch your breath by resting or slowing your pace  or doing a relaxed purse lip breathing pattern.  - The less you use it, the better it will work when you need it. - Ok to use the inhaler up to 2 puffs  every 4 hours if you must but call for appointment if use goes up over your usual need - Don't leave home without it !!  (think of it like the spare tire or starter fluid for your car)   Plan C = Crisis (instead of Plan B but only if Plan B stops working) - only use your albuterol  nebulizer if you first try Plan B and it fails to help > ok to use the nebulizer up to every 4 hours but if start needing it regularly call for immediate appointment   Plan D = Deltasone  = prednisone   If ABC not working: Prednisone  10 mg take  4 each am x 2 days,   2 each am x 2 days,  1 each am x 2 days and stop (refillable)    Make sure you check your oxygen saturation  AT  your highest level of activity (not after you stop)   to be sure it stays over 90% and adjust  02 flow upward to maintain this level if needed but remember to turn it back to previous settings when you stop (to conserve your supply).   Please see a dentist as soon as you can   . Please schedule a follow up visit in 3 months but call sooner if needed    Ozell America, MD 11/23/2024

## 2024-11-23 NOTE — Assessment & Plan Note (Addendum)
 Active smoker with GOLD 4/ group E criteria  - CT 12/02/23 diffuse moderate centrilobular emphysematous changes throughout bilateral lungs - 08/25/2024  After extensive coaching inhaler device,  effectiveness =    75%  DPI so given spiriva  12.5 sample and rx for spiriva  2.5 to add to symbicort  - 08/25/2024   Walked on RA  x  3  lap(s) =  approx 450  ft  @  mod pace, stopped due to sob with lowest 02 sats  87% and mild sob on lap 2   - PFT's  11/23/2024   FEV1 0.46 (14 % ) ratio 0.26  p 22% response to saba and  nothing prior to study with DLCO  5.1 (20%)   and FV curve classically concave    Very severe copd =  Group E in terms of symptoms/risk so  laba/lama/ICS  therefore appropriate rx at this point >>>  spiriva / symbicort  160   and approp SABA prn.  Re SABA :  I spent extra time with pt today reviewing appropriate use of albuterol  for prn use on exertion with the following points: 1) saba is for relief of sob that does not improve by walking a slower pace or resting but rather if the pt does not improve after trying this first. 2) If the pt is convinced, as many are, that saba helps recover from activity faster then it's easy to tell if this is the case by re-challenging : ie stop, take the inhaler, then p 5 minutes try the exact same activity (intensity of workload) that just caused the symptoms and see if they are substantially diminished or not after saba 3) if there is an activity that reproducibly causes the symptoms, try the saba 15 min before the activity on alternate days   If in fact the saba really does help, then fine to continue to use it prn but advised may need to look closer at the maintenance regimen being used to achieve better control of airways disease with exertion.

## 2024-11-23 NOTE — Assessment & Plan Note (Addendum)
 HC03   34     10/29/24  - 11/23/2024   Walked on 1lpm cont    x  3  lap(s) =  approx 450  ft  @ fast pace, stopped due to end of study  with lowest 02 sats 80%    Advised: Make sure you check your oxygen saturation  AT  your highest level of activity (not after you stop)   to be sure it stays over 90% and adjust  02 flow upward to maintain this level if needed but remember to turn it back to previous settings when you stop (to conserve your supply).   F/u 3 m  Each maintenance medication was reviewed in detail including emphasizing most importantly the difference between maintenance and prns and under what circumstances the prns are to be triggered using an action plan format where appropriate.  Total time for H and P, chart review, counseling, reviewing hfa/smi/02/pulse ox device(s) , directly observing portions of ambulatory 02 saturation study/ and generating customized AVS unique to this office visit / same day charting = 35 min

## 2024-11-23 NOTE — Assessment & Plan Note (Addendum)
 4  min discussion re active cigarette smoking in addition to office E&M  Ask about tobacco use:   ongoing  Advise quitting   matter of life or breath talk given severity of copd  Assess willingness:  Not committed at this point Assist in quit attempt:  Per PCP when ready Arrange follow up:   Follow up per Primary Care planned

## 2024-11-26 ENCOUNTER — Ambulatory Visit: Payer: Self-pay | Admitting: Internal Medicine

## 2025-01-03 ENCOUNTER — Telehealth: Payer: Self-pay | Admitting: Internal Medicine

## 2025-01-03 NOTE — Telephone Encounter (Unsigned)
 Copied from CRM [id number]. Topic: General - Other >> Jan 03, 2025 11:28 AM Adam Mercado wrote: Reason for CRM: Patient is calling to request provider write a letter to state patient is on oxygen 24/7, as well as to disclose medications patient is currently taking. 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 Requesting that letter be written and placed for pickup, please notify patient when letter is done. States needs it done before 02/03 and has no PCP so is requesting Dr. Darlean write it.

## 2025-01-09 ENCOUNTER — Telehealth: Payer: Self-pay

## 2025-01-09 ENCOUNTER — Observation Stay (HOSPITAL_COMMUNITY)
Admission: EM | Admit: 2025-01-09 | Discharge: 2025-01-09 | Disposition: A | Discharge status: Home / Self Care | Attending: Emergency Medicine | Admitting: Emergency Medicine

## 2025-01-09 ENCOUNTER — Encounter: Payer: Self-pay | Admitting: Emergency Medicine

## 2025-01-09 ENCOUNTER — Other Ambulatory Visit: Payer: Self-pay

## 2025-01-09 ENCOUNTER — Emergency Department (HOSPITAL_COMMUNITY)

## 2025-01-09 ENCOUNTER — Encounter (HOSPITAL_COMMUNITY): Payer: Self-pay

## 2025-01-09 DIAGNOSIS — F1010 Alcohol abuse, uncomplicated: Secondary | ICD-10-CM | POA: Insufficient documentation

## 2025-01-09 DIAGNOSIS — F17210 Nicotine dependence, cigarettes, uncomplicated: Secondary | ICD-10-CM

## 2025-01-09 DIAGNOSIS — J449 Chronic obstructive pulmonary disease, unspecified: Principal | ICD-10-CM | POA: Insufficient documentation

## 2025-01-09 DIAGNOSIS — F1290 Cannabis use, unspecified, uncomplicated: Secondary | ICD-10-CM | POA: Insufficient documentation

## 2025-01-09 DIAGNOSIS — J9612 Chronic respiratory failure with hypercapnia: Secondary | ICD-10-CM

## 2025-01-09 DIAGNOSIS — J441 Chronic obstructive pulmonary disease with (acute) exacerbation: Principal | ICD-10-CM | POA: Diagnosis present

## 2025-01-09 DIAGNOSIS — Z7982 Long term (current) use of aspirin: Secondary | ICD-10-CM | POA: Insufficient documentation

## 2025-01-09 DIAGNOSIS — M7989 Other specified soft tissue disorders: Secondary | ICD-10-CM | POA: Insufficient documentation

## 2025-01-09 LAB — BASIC METABOLIC PANEL WITH GFR
Anion gap: 13 (ref 5–15)
BUN: 13 mg/dL (ref 6–20)
CO2: 32 mmol/L (ref 22–32)
Calcium: 9.3 mg/dL (ref 8.9–10.3)
Chloride: 101 mmol/L (ref 98–111)
Creatinine, Ser: 1.12 mg/dL (ref 0.61–1.24)
GFR, Estimated: >60 mL/min
Glucose, Bld: 87 mg/dL (ref 70–99)
Potassium: 4.2 mmol/L (ref 3.5–5.1)
Sodium: 145 mmol/L (ref 135–145)

## 2025-01-09 LAB — CBC
HCT: 39.4 % (ref 39.0–52.0)
Hemoglobin: 12.8 g/dL — ABNORMAL LOW (ref 13.0–17.0)
MCH: 32.2 pg (ref 26.0–34.0)
MCHC: 32.5 g/dL (ref 30.0–36.0)
MCV: 99.2 fL (ref 80.0–100.0)
Platelets: 268 10*3/uL (ref 150–400)
RBC: 3.97 MIL/uL — ABNORMAL LOW (ref 4.22–5.81)
RDW: 13.6 % (ref 11.5–15.5)
WBC: 8.3 10*3/uL (ref 4.0–10.5)
nRBC: 0 % (ref 0.0–0.2)

## 2025-01-09 LAB — BLOOD GAS, VENOUS
Acid-Base Excess: 6.8 mmol/L — ABNORMAL HIGH (ref 0.0–2.0)
Acid-Base Excess: 8.8 mmol/L — ABNORMAL HIGH (ref 0.0–2.0)
Bicarbonate: 35.7 mmol/L — ABNORMAL HIGH (ref 20.0–28.0)
Bicarbonate: 37 mmol/L — ABNORMAL HIGH (ref 20.0–28.0)
Drawn by: 4237
Drawn by: 4237
O2 Saturation: 23.1 %
O2 Saturation: 27 %
Patient temperature: 36.8
Patient temperature: 36.9
pCO2, Ven: 70 mmHg — ABNORMAL HIGH (ref 44–60)
pCO2, Ven: 67 mmHg — ABNORMAL HIGH (ref 44–60)
pH, Ven: 7.31 (ref 7.25–7.43)
pH, Ven: 7.35 (ref 7.25–7.43)
pO2, Ven: <31 mmHg — CL (ref 32–45)
pO2, Ven: <31 mmHg — CL (ref 32–45)

## 2025-01-09 LAB — TROPONIN T, HIGH SENSITIVITY
Troponin T High Sensitivity: 13 ng/L (ref 0–19)
Troponin T High Sensitivity: 16 ng/L (ref 0–19)

## 2025-01-09 LAB — RESP PANEL BY RT-PCR (RSV, FLU A&B, COVID)  RVPGX2
Influenza A by PCR: NEGATIVE
Influenza B by PCR: NEGATIVE
Resp Syncytial Virus by PCR: NEGATIVE
SARS Coronavirus 2 by RT PCR: NEGATIVE

## 2025-01-09 LAB — PRO BRAIN NATRIURETIC PEPTIDE: Pro Brain Natriuretic Peptide: 98.5 pg/mL

## 2025-01-09 MED ORDER — FUROSEMIDE 10 MG/ML IJ SOLN
20.0000 mg | Freq: Once | INTRAMUSCULAR | Status: AC
  Administered 2025-01-09: 20 mg via INTRAVENOUS
  Filled 2025-01-09: qty 2

## 2025-01-09 MED ORDER — METHYLPREDNISOLONE SODIUM SUCC 125 MG IJ SOLR
125.0000 mg | Freq: Once | INTRAMUSCULAR | Status: AC
  Administered 2025-01-09: 125 mg via INTRAVENOUS
  Filled 2025-01-09: qty 2

## 2025-01-09 MED ORDER — FUROSEMIDE 20 MG PO TABS: 20.0000 mg | ORAL_TABLET | Freq: Every day | ORAL | 0 refills | Status: AC

## 2025-01-09 MED ORDER — IPRATROPIUM-ALBUTEROL 0.5-2.5 (3) MG/3ML IN SOLN
3.0000 mL | Freq: Once | RESPIRATORY_TRACT | Status: AC
  Administered 2025-01-09: 3 mL via RESPIRATORY_TRACT
  Filled 2025-01-09: qty 3

## 2025-01-09 NOTE — Discharge Instructions (Addendum)
 Thank you for coming to [facility] Emergency Department. You were seen for COPD exacerbation.  Please wear your oxygen at home. Please use your nebulizers/inhalers as prescribed. Please take your prednisone  taper as prescribed.   You can also take 20 mg of lasix  once per day for 3 days for leg swelling.   Please follow up with your primary care provider within 1 week.   Do not hesitate to return to the ED or call 911 if you experience: -Worsening symptoms -Lightheadedness, passing out -Fevers/chills -Anything else that concerns you

## 2025-01-09 NOTE — Telephone Encounter (Signed)
 Copied from CRM #[id number]. Topic: General - Other >> Jan 04, 2025  8:47 AM [name] wrote: Reason for CRM: Patient is requesting a document that states he is on 24/7 oxygen for his probation officer, as he is being sent to a camp but needs to this document so that he can be sent to a medical camp instead.   Patient states he will be calling back with his probation officers fax # >> Jan 08, 2025 10:59 AM [name] wrote: Patient [phone] states needs medical letter that patient is on oxygen 24 hours for probation officer, patient is going to be sent to camp, but it has a medical camp. Patient states deadline is 01/09/25, patient has been calling since last week. Patient states the letter needs to be email to [email or web address] Informed patient, the office closed. Please advise and call back.   >> Jan 04, 2025 12:33 PM [name] wrote: Adam Mercado called back to provide an email address for his probation officer - [email or web address] - please send letter directly to her  >> Jan 04, 2025 12:10 PM [name] wrote: Patient called and stated that he couldn't get ahold of probation officer for the fax number, so he is now wondering if he can come by and pick it up - he states he needs the letter before 01/09/2025 - he is requesting a call back with an update asap ph# [phone]  Due to weather have not seen or been able to get to some messages (messages are responded to in order)

## 2025-01-09 NOTE — ED Notes (Signed)
 Ct transport canceled en route. Wrong pt.

## 2025-01-09 NOTE — Consult Note (Signed)
 Initial Consultation Note   Patient: Adam Mercado FMW:[id number] DOB: 07/11/1966 PCP: Benjamin Raina Elizabeth, NP DOA: 01/09/2025 DOS: the patient was seen and examined on 01/09/2025 Primary service: Franklyn Sid SAILOR, MD  Referring physician: Dr. Franklyn Reason for consult: AECOPD  Assessment/Plan: Assessment and Plan: Acute COPD exacerbation: Seems to have been severe at onset, but after a duoneb 4 hours ago, wheezing has resolved. He feels like he is at his respiratory baseline. Always gets some short of breath when walking, didn't truly desaturate when ambulating after rendering treatments in the ED.  - Continue steroid burst - Will need albuterol  neb solution prescribed to [facility] on [facility]. He does have functional nebulizer and agrees to using this around the clock for the next 24-48 hours then as needed.  - Continue supplemental oxygen as he has already arranged at home. - Pulmonary follow up recommended (Dr. Darlean).  - CXR clear, doesn't tell me about purulent sputum or increased volume. Defer abx to EDP.  - Given his clinical stability and resolution of bronchospasm clinically, does not meet criteria for medical admission. Discussed with Dr. Franklyn, whose management is appreciated.   L > R leg swelling: Negative LE venous U/S.  - Suggest PCP work up after discharge.     HPI: Adam Mercado is a 59 y.o. male with past medical history of drug use, COPD, chronic respiratory failure on home 2L O2 who presented this morning for shortness of breath. He activated EMS after he'd grown severely short of breath, was found to be hypoxic while not wearing his oxygen at home. Given O2 with improvement on 2L. Got a duoneb by EMS, given IV solumedrol, and a dose of lasix . He reports he feels better, ambulated in room with SpO2 96-97% on 3L. Did have dyspnea with that, but tells me he feels at his baseline now at rest.   He does have a functional nebulizer at home but no medication for it. When he's used  medication in the past it helped. He denies chest pain. When I walk in the room his first statement is I need a letter for my probation officer saying I wear oxygen at home. I see he's also called his pulmonologist's office for this.   He has some swelling always in his legs, asymmetric, not worse lately. Denies and surgeries to that extremity.   work up in ED included VBG with normal pH, elevated pCO2 (suspected baseline). Troponin normal x2, proBNP normal. BMP normal. CBC with hgb 12.8 as only abnormality. Flu, RSV, covid PCRs negative. ECG personally interpreted with NSR vent rate in 80's, nonspecific ST changes unchanged from prior. CXR I interpret as hyperinflated without infiltrate, PTX, effusion, cardiomegaly, or any acute process (matches radiologist interpretation). Venous U/S demonstrated no DVT.   Review of Systems: As mentioned in the history of present illness. All other systems reviewed and are negative. Past Medical History:  Diagnosis Date   Alcohol abuse    COPD (chronic obstructive pulmonary disease) (HCC)    Drug use    cocaine marijuana   Hypercholesterolemia    Hyperlipidemia    Tuberculosis    Past Surgical History:  Procedure Laterality Date   CARDIAC CATHETERIZATION N/A 08/24/2016   Procedure: Left Heart Cath and Coronary Angiography;  Surgeon: Peter M Jordan, MD;  Location: [facility] INVASIVE CV LAB;  Service: Cardiovascular;  Laterality: N/A;   FINGER SURGERY     Social History:  reports that he has been smoking cigarettes. He has a 15 pack-year smoking  history. He has never used smokeless tobacco. He reports current alcohol use. He reports current drug use. Drugs: Cocaine and Marijuana.  Allergies[1]  Family History  Problem Relation Age of Onset   Hypertension Mother    Kidney disease Mother    Hypertension Father    Stroke Father    Stroke Sister    Diabetes Brother    Colon cancer Neg Hx    Colon polyps Neg Hx     Prior to Admission medications   Medication Sig Start Date End Date Taking? Authorizing Provider  furosemide  (LASIX ) 20 MG tablet Take 1 tablet (20 mg total) by mouth daily. 01/09/25 01/12/25 Yes Franklyn Sid SAILOR, MD  albuterol  (PROVENTIL  HFA) 108 (90 Base) MCG/ACT inhaler INHALE 2 PUFFS BY MOUTH EVERY 4 HOURS AS NEEDED FOR COUGHING, WHEEZING, OR SHORTNESS OF BREATH 11/23/24   Darlean Ozell NOVAK, MD  aspirin  EC 81 MG EC tablet Take 1 tablet (81 mg total) by mouth daily. Patient not taking: Reported on 11/23/2024 08/25/16   Singh, Prashant K, MD  atorvastatin  (LIPITOR) 20 MG tablet Take 20 mg by mouth daily. 10/10/24   [provider]  budesonide -formoterol  (SYMBICORT ) 160-4.5 MCG/ACT inhaler Take 2 puffs first thing in am and then another 2 puffs about 12 hours later. 11/23/24   Darlean Ozell NOVAK, MD  guaiFENesin -dextromethorphan  (ROBITUSSIN DM) 100-10 MG/5ML syrup Take 5 mLs by mouth every 4 (four) hours as needed for cough. 10/29/24   Maree, Pratik D, DO  ibuprofen (ADVIL) 200 MG tablet Take 400 mg by mouth 2 (two) times daily as needed for moderate pain (pain score 4-6) or headache.    [provider]  predniSONE  (DELTASONE ) 10 MG tablet Take  4 each am x 2 days,   2 each am x 2 days,  1 each am x 2 days and stop 11/23/24   Darlean Ozell NOVAK, MD  Tiotropium Bromide  2.5 MCG/ACT AERS Inhale 5 mcg into the lungs daily. 2 puffs daily 10/17/24   [provider]    Physical Exam: Vitals:   01/09/25 1300 01/09/25 1319 01/09/25 1320 01/09/25 1330  BP: 129/72     Pulse: 80 71 74 79  Resp: 19 (!) 23 (!) 21 20  Temp:      TempSrc:      SpO2: 96% 92% 92% 95%  Weight:      Height:      Gen: Thin 58yo M in no distress Pulm: Clear, nonlabored, RR 16 at rest, speaking full sentences.  No wheezes or crackles CV: RRR, no MRG, LLE edema is nontender without erythema, pitting.  GI: Soft, NT, ND, +BS  Neuro: Alert and oriented. No new focal deficits. Ext: Warm, no deformities. Skin: No rashes, lesions or ulcers on  visualized skin    Bernardino NOVAK Come, MD 01/09/2025 1:56 PM  For on call review [email or web address].      [1] No Known Allergies

## 2025-01-09 NOTE — ED Triage Notes (Addendum)
 Pt bib [facility] for SOB. Pt has COPD and had nebulizer at home. Pt states he is out of his nebulizers. EMS states when they arrived pt was walking outside and O2 sat was 45% RA, placed on a NRB sats 100%, EMS gave a duo neb in route. Pt is now at 88% RA placed on 2L. EMS placed a 20G in LAC

## 2025-01-09 NOTE — Telephone Encounter (Signed)
 Typed letter and sending to email provided - also called number on chart to inform that the letter is ready for pick up if he is still interested

## 2025-01-09 NOTE — ED Notes (Addendum)
 Ambulated patient back and forth in room on 3L of O2. Sat 96-97%. When back in bed dropped to 92-94.

## 2025-01-09 NOTE — ED Provider Notes (Signed)
 " [place] EMERGENCY DEPARTMENT AT [facility] Provider Note   CSN: [id number] Arrival date & time: 01/09/25  9258     History {Add pertinent medical, surgical, social history, OB history to HPI:1} Chief Complaint  Patient presents with   Shortness of Breath    Adam Mercado is a 59 y.o. male with PMH as listed below who presents bib [facility] for SOB. Reports he's been SOB for weeks to months. Has had increased cough with sputum as well that his PCP told him to take mucinex  for that helped some. He reports he has had DOE and has had to take steroids. Pt has COPD and had nebulizer at home. Pt states he is out of his nebulizers. He states the nebulizers do help. He states he is currently on steroids. He states that his legs have been swelling as well and getting worse, L>R. No pain. He has noticed that it used to be just his feet that would swell but now it goes up above his knees. He doesn't take any diuretics at home.   EMS states when they arrived pt was walking outside and O2 sat was 45% RA, placed on a NRB sats 100%, EMS gave a duo neb in route. Pt is now at 88% RA placed on 2L.   Admitted from 10/27/24-11/23-25 for COPD exacerbation. Saw [facility] on 11/23/24 to establish care with Dr. Darlean. He wears oxygen at home, 1-2 liters depending on his activity level.   Past Medical History:  Diagnosis Date   Alcohol abuse    COPD (chronic obstructive pulmonary disease) (HCC)    Drug use    cocaine marijuana   Hypercholesterolemia    Hyperlipidemia    Tuberculosis        Home Medications Prior to Admission medications  Medication Sig Start Date End Date Taking? Authorizing Provider  albuterol  (PROVENTIL  HFA) 108 (90 Base) MCG/ACT inhaler INHALE 2 PUFFS BY MOUTH EVERY 4 HOURS AS NEEDED FOR COUGHING, WHEEZING, OR SHORTNESS OF BREATH 11/23/24   Darlean Ozell NOVAK, MD  aspirin  EC 81 MG EC tablet Take 1 tablet (81 mg total) by mouth daily. Patient not taking: Reported on  11/23/2024 08/25/16   Singh, Prashant K, MD  atorvastatin  (LIPITOR) 20 MG tablet Take 20 mg by mouth daily. 10/10/24   [provider]  budesonide -formoterol  (SYMBICORT ) 160-4.5 MCG/ACT inhaler Take 2 puffs first thing in am and then another 2 puffs about 12 hours later. 11/23/24   Darlean Ozell NOVAK, MD  guaiFENesin -dextromethorphan  (ROBITUSSIN DM) 100-10 MG/5ML syrup Take 5 mLs by mouth every 4 (four) hours as needed for cough. 10/29/24   Maree, Pratik D, DO  ibuprofen (ADVIL) 200 MG tablet Take 400 mg by mouth 2 (two) times daily as needed for moderate pain (pain score 4-6) or headache.    [provider]  predniSONE  (DELTASONE ) 10 MG tablet Take  4 each am x 2 days,   2 each am x 2 days,  1 each am x 2 days and stop 11/23/24   Darlean Ozell NOVAK, MD  Tiotropium Bromide  2.5 MCG/ACT AERS Inhale 5 mcg into the lungs daily. 2 puffs daily 10/17/24   [provider]      Allergies    Patient has no known allergies.    Review of Systems   Review of Systems A 10 point review of systems was performed and is negative unless otherwise reported in HPI.  Physical Exam Updated Vital Signs Ht 5' 10 (1.778 m)  Wt 59 kg   BMI 18.65 kg/m  Physical Exam General: Normal appearing male, lying in bed.  HEENT: PERRLA, Sclera anicteric, MMM, trachea midline. Poor dentition and multiple missing teeth.  Cardiology: RRR, no murmurs/rubs/gallops.  Resp: Mild tachypnea with no increased WOB. He has mild expiratory wheezing. Satting 95% on 2L [place].  Abd: Soft, non-tender, non-distended. No rebound tenderness or guarding.  GU: Deferred. MSK: 2+ pitting edema in LLE, and 1+ pitting edema in RLE up to knees. No signs of trauma. Extremities without deformity or TTP.  Skin: warm, dry.  Neuro: A&Ox4, CNs II-XII grossly intact. MAEs. Sensation grossly intact.  Psych: Normal mood and affect.   ED Results / Procedures / Treatments   Labs (all labs ordered are listed, but only abnormal results are  displayed) Labs Reviewed  BASIC METABOLIC PANEL WITH GFR  CBC    EKG None  Radiology No results found.  Procedures Procedures  {Document cardiac monitor, telemetry assessment procedure when appropriate:1}  Medications Ordered in ED Medications - No data to display  ED Course/ Medical Decision Making/ A&P                          Medical Decision Making Amount and/or Complexity of Data Reviewed Labs: ordered. Radiology: ordered.  Risk Prescription drug management.    This patient presents to the ED for concern of ***, this involves an extensive number of treatment options, and is a complaint that carries with it a high risk of complications and morbidity.  I considered the following differential and admission for this acute, potentially life threatening condition.   MDM:    DDX for dyspnea includes but is not limited to:  Patient likely w/ COPD given his h/o COPD and improvement with duonebs and reported improvement w/ recent  steroids. He is currently satting 95% on 2L [place], which he uses 1-2 L at home of O2. Will give additional duonebs x2 and solumedrol 125 mg IV. He also reports increased cough/phlegm, CXR *** show PNA. Resp panel neg. He also reports increased leg swelling, consider CHF. Last echo was in 2017 and was LVEF 60-65% with normal R ventricular systolic function. The swelling is L>R, so will obtain DVT US  as well. With wheezing, ***overall doubt PE. EKG w/ no signs of ischemia/arrhythmia, and troponin ***.      Labs: I Ordered, and personally interpreted labs.  The pertinent results include:  those listed above  Imaging Studies ordered: I ordered imaging studies including CXR, LLE DVT US  I independently visualized and interpreted imaging. I agree with the radiologist interpretation  Additional history obtained from EMS, chart review.  Cardiac Monitoring: The patient was maintained on a cardiac monitor.  I personally viewed and interpreted the cardiac  monitored which showed an underlying rhythm of: NSR  Reevaluation: After the interventions noted above, I reevaluated the patient and found that they have :improved  Social Determinants of Health: Lives independently  Disposition:  ***  Co morbidities that complicate the patient evaluation  Past Medical History:  Diagnosis Date   Alcohol abuse    COPD (chronic obstructive pulmonary disease) (HCC)    Drug use    cocaine marijuana   Hypercholesterolemia    Hyperlipidemia    Tuberculosis      Medicines No orders of the defined types were placed in this encounter.   I have reviewed the patients home medicines and have made adjustments as needed  Problem List / ED Course: Problem  List Items Addressed This Visit   None        {Document critical care time when appropriate:1} {Document review of labs and clinical decision tools ie heart score, Chads2Vasc2 etc:1}  {Document your independent review of radiology images, and any outside records:1} {Document your discussion with family members, caretakers, and with consultants:1} {Document social determinants of health affecting pt's care:1} {Document your decision making why or why not admission, treatments were needed:1}  This note was created using dictation software, which may contain spelling or grammatical errors.  "

## 2025-02-21 ENCOUNTER — Ambulatory Visit: Admitting: Internal Medicine
